# Patient Record
Sex: Male | Born: 1960
Health system: Southern US, Community
[De-identification: ages and names within clinical notes are randomized; demographics above are authoritative.]

## PROBLEM LIST (undated history)

## (undated) DIAGNOSIS — E785 Hyperlipidemia, unspecified: Secondary | ICD-10-CM

## (undated) DIAGNOSIS — C801 Malignant (primary) neoplasm, unspecified: Secondary | ICD-10-CM

## (undated) DIAGNOSIS — M51369 Other intervertebral disc degeneration, lumbar region without mention of lumbar back pain or lower extremity pain: Secondary | ICD-10-CM

## (undated) DIAGNOSIS — M5136 Other intervertebral disc degeneration, lumbar region: Secondary | ICD-10-CM

## (undated) DIAGNOSIS — G4730 Sleep apnea, unspecified: Secondary | ICD-10-CM

## (undated) HISTORY — PX: TONSILLECTOMY: SUR1361

## (undated) HISTORY — PX: KNEE SURGERY: SHX244

## (undated) HISTORY — PX: JOINT REPLACEMENT: SHX530

---

## 2000-12-04 ENCOUNTER — Ambulatory Visit (HOSPITAL_BASED_OUTPATIENT_CLINIC_OR_DEPARTMENT_OTHER): Admission: RE | Admit: 2000-12-04 | Discharge: 2000-12-05 | Payer: Self-pay | Admitting: *Deleted

## 2003-11-24 ENCOUNTER — Ambulatory Visit: Admission: RE | Admit: 2003-11-24 | Discharge: 2003-11-24 | Payer: Self-pay | Admitting: Pulmonary Disease

## 2014-12-19 ENCOUNTER — Encounter: Payer: Self-pay | Admitting: Internal Medicine

## 2016-06-29 ENCOUNTER — Other Ambulatory Visit: Payer: Self-pay | Admitting: Surgery

## 2016-06-29 DIAGNOSIS — S83241D Other tear of medial meniscus, current injury, right knee, subsequent encounter: Secondary | ICD-10-CM

## 2016-06-29 DIAGNOSIS — S86911D Strain of unspecified muscle(s) and tendon(s) at lower leg level, right leg, subsequent encounter: Secondary | ICD-10-CM

## 2016-07-07 ENCOUNTER — Ambulatory Visit
Admission: RE | Admit: 2016-07-07 | Discharge: 2016-07-07 | Disposition: A | Discharge status: Home / Self Care | Payer: 59 | Source: Ambulatory Visit | Attending: Surgery | Admitting: Surgery

## 2016-07-07 DIAGNOSIS — X58XXXD Exposure to other specified factors, subsequent encounter: Secondary | ICD-10-CM | POA: Insufficient documentation

## 2016-07-07 DIAGNOSIS — S86911D Strain of unspecified muscle(s) and tendon(s) at lower leg level, right leg, subsequent encounter: Secondary | ICD-10-CM | POA: Diagnosis present

## 2016-07-07 DIAGNOSIS — S83241D Other tear of medial meniscus, current injury, right knee, subsequent encounter: Secondary | ICD-10-CM | POA: Insufficient documentation

## 2016-07-07 DIAGNOSIS — M1711 Unilateral primary osteoarthritis, right knee: Secondary | ICD-10-CM | POA: Diagnosis not present

## 2016-08-04 ENCOUNTER — Encounter: Payer: Self-pay | Admitting: *Deleted

## 2016-08-10 ENCOUNTER — Encounter
Admission: RE | Disposition: A | Discharge status: Home / Self Care | Payer: Self-pay | Source: Ambulatory Visit | Attending: Surgery

## 2016-08-10 ENCOUNTER — Ambulatory Visit: Payer: 59 | Admitting: Anesthesiology

## 2016-08-10 ENCOUNTER — Ambulatory Visit
Admission: RE | Admit: 2016-08-10 | Discharge: 2016-08-10 | Disposition: A | Discharge status: Home / Self Care | Payer: 59 | Source: Ambulatory Visit | Attending: Surgery | Admitting: Surgery

## 2016-08-10 DIAGNOSIS — G4730 Sleep apnea, unspecified: Secondary | ICD-10-CM | POA: Diagnosis not present

## 2016-08-10 DIAGNOSIS — M1711 Unilateral primary osteoarthritis, right knee: Secondary | ICD-10-CM | POA: Diagnosis not present

## 2016-08-10 DIAGNOSIS — M94261 Chondromalacia, right knee: Secondary | ICD-10-CM | POA: Insufficient documentation

## 2016-08-10 DIAGNOSIS — S83241A Other tear of medial meniscus, current injury, right knee, initial encounter: Secondary | ICD-10-CM | POA: Diagnosis not present

## 2016-08-10 DIAGNOSIS — X58XXXA Exposure to other specified factors, initial encounter: Secondary | ICD-10-CM | POA: Insufficient documentation

## 2016-08-10 DIAGNOSIS — F17200 Nicotine dependence, unspecified, uncomplicated: Secondary | ICD-10-CM | POA: Insufficient documentation

## 2016-08-10 DIAGNOSIS — M659 Synovitis and tenosynovitis, unspecified: Secondary | ICD-10-CM | POA: Diagnosis not present

## 2016-08-10 DIAGNOSIS — Z79899 Other long term (current) drug therapy: Secondary | ICD-10-CM | POA: Insufficient documentation

## 2016-08-10 DIAGNOSIS — E785 Hyperlipidemia, unspecified: Secondary | ICD-10-CM | POA: Diagnosis not present

## 2016-08-10 HISTORY — PX: KNEE ARTHROSCOPY: SHX127

## 2016-08-10 HISTORY — DX: Other intervertebral disc degeneration, lumbar region without mention of lumbar back pain or lower extremity pain: M51.369

## 2016-08-10 HISTORY — DX: Sleep apnea, unspecified: G47.30

## 2016-08-10 HISTORY — DX: Hyperlipidemia, unspecified: E78.5

## 2016-08-10 HISTORY — DX: Other intervertebral disc degeneration, lumbar region: M51.36

## 2016-08-10 SURGERY — ARTHROSCOPY, KNEE
Anesthesia: General | Site: Knee | Laterality: Right | Wound class: Clean

## 2016-08-10 MED ORDER — METOCLOPRAMIDE HCL 5 MG/ML IJ SOLN: 5.0000 mg | Freq: Three times a day (TID) | INTRAMUSCULAR | Status: DC | PRN

## 2016-08-10 MED ORDER — OXYCODONE HCL 5 MG PO TABS
5.0000 mg | ORAL_TABLET | Freq: Once | ORAL | Status: AC | PRN
  Administered 2016-08-10: 5 mg via ORAL

## 2016-08-10 MED ORDER — LIDOCAINE HCL (CARDIAC) 20 MG/ML IV SOLN
INTRAVENOUS | Status: DC | PRN
  Administered 2016-08-10: 50 mg via INTRATRACHEAL

## 2016-08-10 MED ORDER — OXYCODONE HCL 5 MG/5ML PO SOLN: 5.0000 mg | Freq: Once | ORAL | Status: AC | PRN

## 2016-08-10 MED ORDER — DEXAMETHASONE SODIUM PHOSPHATE 4 MG/ML IJ SOLN
INTRAMUSCULAR | Status: DC | PRN
  Administered 2016-08-10: 8 mg via INTRAVENOUS

## 2016-08-10 MED ORDER — FENTANYL CITRATE (PF) 100 MCG/2ML IJ SOLN
INTRAMUSCULAR | Status: DC | PRN
  Administered 2016-08-10: 100 ug via INTRAVENOUS

## 2016-08-10 MED ORDER — ONDANSETRON HCL 4 MG/2ML IJ SOLN: 4.0000 mg | Freq: Four times a day (QID) | INTRAMUSCULAR | Status: DC | PRN

## 2016-08-10 MED ORDER — ONDANSETRON HCL 4 MG/2ML IJ SOLN: 4.0000 mg | Freq: Once | INTRAMUSCULAR | Status: DC | PRN

## 2016-08-10 MED ORDER — PROPOFOL 10 MG/ML IV BOLUS
INTRAVENOUS | Status: DC | PRN
  Administered 2016-08-10: 150 mg via INTRAVENOUS

## 2016-08-10 MED ORDER — ONDANSETRON HCL 4 MG/2ML IJ SOLN
INTRAMUSCULAR | Status: DC | PRN
  Administered 2016-08-10: 4 mg via INTRAVENOUS

## 2016-08-10 MED ORDER — MIDAZOLAM HCL 5 MG/5ML IJ SOLN
INTRAMUSCULAR | Status: DC | PRN
  Administered 2016-08-10: 2 mg via INTRAVENOUS

## 2016-08-10 MED ORDER — HYDROCODONE-ACETAMINOPHEN 5-325 MG PO TABS: 1.0000 | ORAL_TABLET | ORAL | Status: DC | PRN

## 2016-08-10 MED ORDER — ACETAMINOPHEN 160 MG/5ML PO SOLN: 325.0000 mg | ORAL | Status: DC | PRN

## 2016-08-10 MED ORDER — BUPIVACAINE-EPINEPHRINE 0.25% -1:200000 IJ SOLN
INTRAMUSCULAR | Status: DC | PRN
Start: 2016-08-10 — End: 2016-08-10
  Administered 2016-08-10: 20 mL

## 2016-08-10 MED ORDER — METOCLOPRAMIDE HCL 5 MG PO TABS: 5.0000 mg | ORAL_TABLET | Freq: Three times a day (TID) | ORAL | Status: DC | PRN

## 2016-08-10 MED ORDER — ONDANSETRON HCL 4 MG PO TABS: 4.0000 mg | ORAL_TABLET | Freq: Four times a day (QID) | ORAL | Status: DC | PRN

## 2016-08-10 MED ORDER — CEFAZOLIN SODIUM-DEXTROSE 2-4 GM/100ML-% IV SOLN
2.0000 g | Freq: Once | INTRAVENOUS | Status: AC
  Administered 2016-08-10: 2 g via INTRAVENOUS

## 2016-08-10 MED ORDER — HYDROCODONE-ACETAMINOPHEN 5-325 MG PO TABS: 1.0000 | ORAL_TABLET | Freq: Four times a day (QID) | ORAL | 0 refills | Status: DC | PRN

## 2016-08-10 MED ORDER — ACETAMINOPHEN 325 MG PO TABS
325.0000 mg | ORAL_TABLET | ORAL | Status: DC | PRN
  Administered 2016-08-10: 650 mg via ORAL

## 2016-08-10 MED ORDER — LIDOCAINE HCL 1 % IJ SOLN
INTRAMUSCULAR | Status: DC | PRN
  Administered 2016-08-10: 60 mL via INTRAMUSCULAR

## 2016-08-10 MED ORDER — POTASSIUM CHLORIDE IN NACL 20-0.9 MEQ/L-% IV SOLN: INTRAVENOUS | Status: DC

## 2016-08-10 MED ORDER — LACTATED RINGERS IV SOLN
INTRAVENOUS | Status: DC
  Administered 2016-08-10: 12:00:00 via INTRAVENOUS

## 2016-08-10 MED ORDER — HYDROMORPHONE HCL 1 MG/ML IJ SOLN: 0.2500 mg | INTRAMUSCULAR | Status: DC | PRN

## 2016-08-10 SURGICAL SUPPLY — 32 items
BANDAGE ELASTIC 6 LF NS (GAUZE/BANDAGES/DRESSINGS) ×2 IMPLANT
BLADE FULL RADIUS 3.5 (BLADE) ×2 IMPLANT
BNDG CMPR MED 5X6 ELC HKLP NS (GAUZE/BANDAGES/DRESSINGS) ×1
BUR ACROMIONIZER 4.0 (BURR) ×1 IMPLANT
CHLORAPREP W/TINT 26ML (MISCELLANEOUS) ×2 IMPLANT
COVER LIGHT HANDLE UNIVERSAL (MISCELLANEOUS) ×4 IMPLANT
CUFF TOURN SGL QUICK 30 (MISCELLANEOUS)
CUFF TOURN SGL QUICK 34 (TOURNIQUET CUFF) ×2
CUFF TRNQT CYL 34X4X40X1 (TOURNIQUET CUFF) IMPLANT
CUFF TRNQT CYL LO 30X4X (MISCELLANEOUS) IMPLANT
DRAPE IMP U-DRAPE 54X76 (DRAPES) ×2 IMPLANT
GAUZE SPONGE 4X4 12PLY STRL (GAUZE/BANDAGES/DRESSINGS) ×2 IMPLANT
GLOVE BIO SURGEON STRL SZ8 (GLOVE) ×4 IMPLANT
GLOVE INDICATOR 8.0 STRL GRN (GLOVE) ×2 IMPLANT
GOWN STRL REUS W/ TWL LRG LVL3 (GOWN DISPOSABLE) ×1 IMPLANT
GOWN STRL REUS W/ TWL XL LVL3 (GOWN DISPOSABLE) ×1 IMPLANT
GOWN STRL REUS W/TWL LRG LVL3 (GOWN DISPOSABLE) ×2
GOWN STRL REUS W/TWL XL LVL3 (GOWN DISPOSABLE) ×2
IV LACTATED RINGER IRRG 3000ML (IV SOLUTION) ×2
IV LR IRRIG 3000ML ARTHROMATIC (IV SOLUTION) ×2 IMPLANT
KIT ROOM TURNOVER OR (KITS) ×2 IMPLANT
MANIFOLD 4PT FOR NEPTUNE1 (MISCELLANEOUS) ×2 IMPLANT
NDL HYPO 21X1.5 SAFETY (NEEDLE) ×2 IMPLANT
NEEDLE HYPO 21X1.5 SAFETY (NEEDLE) ×4 IMPLANT
PACK ARTHROSCOPY KNEE (MISCELLANEOUS) ×2 IMPLANT
STRAP BODY AND KNEE 60X3 (MISCELLANEOUS) ×2 IMPLANT
SUT PROLENE 4 0 PS 2 18 (SUTURE) ×2 IMPLANT
SUT VIC AB 2-0 CT1 27 (SUTURE)
SUT VIC AB 2-0 CT1 TAPERPNT 27 (SUTURE) IMPLANT
SYR 50ML LL SCALE MARK (SYRINGE) ×2 IMPLANT
TUBING ARTHRO INFLOW-ONLY STRL (TUBING) ×2 IMPLANT
WAND HAND CNTRL MULTIVAC 90 (MISCELLANEOUS) ×1 IMPLANT

## 2016-08-10 NOTE — Discharge Instructions (Signed)
General Anesthesia, Adult, Care After These instructions provide you with information about caring for yourself after your procedure. Your health care provider may also give you more specific instructions. Your treatment has been planned according to current medical practices, but problems sometimes occur. Call your health care provider if you have any problems or questions after your procedure. What can I expect after the procedure? After the procedure, it is common to have:  Vomiting.  A sore throat.  Mental slowness. It is common to feel:  Nauseous.  Cold or shivery.  Sleepy.  Tired.  Sore or achy, even in parts of your body where you did not have surgery. Follow these instructions at home: For at least 24 hours after the procedure:  Do not:  Participate in activities where you could fall or become injured.  Drive.  Use heavy machinery.  Drink alcohol.  Take sleeping pills or medicines that cause drowsiness.  Make important decisions or sign legal documents.  Take care of children on your own.  Rest. Eating and drinking  If you vomit, drink water, juice, or soup when you can drink without vomiting.  Drink enough fluid to keep your urine clear or pale yellow.  Make sure you have little or no nausea before eating solid foods.  Follow the diet recommended by your health care provider. General instructions  Have a responsible adult stay with you until you are awake and alert.  Return to your normal activities as told by your health care provider. Ask your health care provider what activities are safe for you.  Take over-the-counter and prescription medicines only as told by your health care provider.  If you smoke, do not smoke without supervision.  Keep all follow-up visits as told by your health care provider. This is important. Contact a health care provider if:  You continue to have nausea or vomiting at home, and medicines are not helpful.  You  cannot drink fluids or start eating again.  You cannot urinate after 8-12 hours.  You develop a skin rash.  You have fever.  You have increasing redness at the site of your procedure. Get help right away if:  You have difficulty breathing.  You have chest pain.  You have unexpected bleeding.  You feel that you are having a life-threatening or urgent problem. This information is not intended to replace advice given to you by your health care provider. Make sure you discuss any questions you have with your health care provider. Document Released: 11/21/2000 Document Revised: 01/18/2016 Document Reviewed: 07/30/2015 Elsevier Interactive Patient Education  2017 [place] dressing dry and intact.  May shower after dressing changed on post-op day #4 (Sunday).  Cover sutures with Band-Aids after drying off. Apply ice frequently to knee. Take ibuprofen 800 mg TID with meals for 7-10 days, then as necessary. Take pain medication as prescribed or ES Tylenol when needed.  May weight-bear as tolerated - use crutches or walker as needed. Follow-up in 10-14 days or as scheduled.

## 2016-08-10 NOTE — Op Note (Signed)
08/10/2016  1:38 PM  Patient:   Tommy Small  Pre-Op Diagnosis:   Medial meniscus tear with underlying degenerative joint disease, right knee.  Postoperative diagnosis:   Same.  Procedure:   Arthroscopic abrasion chondroplasty with microfracturing of lateral femoral condylar lesion, arthroscopic abrasion chondroplasty of diffuse grade 3 chondromalacial changes of femoral trochlea, and arthroscopic partial medial meniscectomy, right knee.  Surgeon:   Pascal Lux, M.D.  Anesthesia:   General LMA.  Findings:   As above. There also were grade 2 chondromalacial changes involving the medial femoral condyle, as well as mild central fraying of the anterolateral portion of the lateral meniscus. The anterior and posterior cruciate ligaments both were in excellent condition. There also was extensive synovitis anteriorly, anterolaterally, and anteromedially.  Complications:   None.  EBL:   10 cc.  Total fluids:   600 cc of crystalloid.  Tourniquet time:   None  Drains:   None  Closure:   4-0 Prolene interrupted sutures.  Brief clinical note:   The patient is a 55 year old male with a several month history of medial sided right knee pain. His symptoms have persisted despite medications, activity modification, etc. His history and examination are consistent with a medial meniscus tear confirmed by MRI scan. The MRI scan also suggested some degenerative changes within the knee. The patient presents at this time for arthroscopy, debridement, and partial medial meniscectomy.  Procedure:   The patient was brought into the operating room and lain in the supine position. After adequate general laryngeal mask anesthesia was obtained, a timeout was performed to verify the appropriate side. The patient's right knee was injected sterilely using a solution of 30 cc of 1% lidocaine and 30 cc of 0.5% Sensorcaine with epinephrine. The right lower extremity was prepped with ChloraPrep solution before being  draped sterilely. Preoperative antibiotics were administered. The expected portal sites were injected with 0.25% Sensorcaine with epinephrine before the camera was placed in the anterolateral portal and instrumentation performed through the anteromedial portal. The knee was sequentially examined beginning in the suprapatellar pouch, then progressing to the patellofemoral space, the medial gutter compartment, the notch, and finally the lateral compartment and gutter. The findings were as described above. Abundant reactive synovial tissues anteriorly were debrided using the full-radius resector in order to improve visualization. There was a primarily horizontal tear involving the posterior medial portion of the medial meniscus with an unstable flap component. This area was debrided back to stable margins using, patient of the straight baskets and full-radius resector. Some probing the remaining rim demonstrated excellent stability. Laterally, there was mild fraying of the anterolateral aspect of the meniscus centrally which was debrided back to stable margins using the full-radius resector. The areas of grade 2 and grade 3 chondromalacia involving the medial femoral condyle and femoral trochlea respectively were debrided back to stable margins using the full-radius resector. Laterally, there was a focal area of more extensive degenerative changes involving the weightbearing portion of the lateral femoral condyle measuring approximately 1.5 cm in diameter. This area was debrided back to stable margins using the full-radius resector. As it was full-thickness, the margins were curetted with an arthroscopic curet before numerous perforations were made into the subchondral bone using an arthroscopic awl to complete the microfracture technique. The inflow was turned off briefly to verify that appropriate blood flow from the bone marrow entered the area of microfracturing. Once this was verified, the instruments were removed  from the joint after suctioning the excess fluid. The  portal sites were closed using 4-0 Prolene interrupted sutures before a sterile bulky dressing was applied to the knee. The patient was then awakened, extubated, and returned to the recovery room in satisfactory condition after tolerating the procedure well.

## 2016-08-10 NOTE — Anesthesia Postprocedure Evaluation (Deleted)
Anesthesia Post Note  Patient: Tommy Small  Procedure(s) Performed: Procedure(s) (LRB): ARTHROSCOPY KNEE WITH DEBRIDEMENT,AND PARTIAL MEDIAL MENISCECTOMY, chondroplasty with microfracture of lateral femoral chondral (Right)  Anesthesia Post Evaluation  Trecia Rogers

## 2016-08-10 NOTE — H&P (Signed)
Paper H&P to be scanned into permanent record. H&P reviewed. No changes. 

## 2016-08-10 NOTE — Anesthesia Postprocedure Evaluation (Signed)
Anesthesia Post Note  Patient: Tommy Small  Procedure(s) Performed: Procedure(s) (LRB): ARTHROSCOPY KNEE WITH DEBRIDEMENT,AND PARTIAL MEDIAL MENISCECTOMY, chondroplasty with microfracture of lateral femoral chondral (Right)  Patient location during evaluation: PACU Anesthesia Type: General Level of consciousness: awake and alert Pain management: pain level controlled Vital Signs Assessment: post-procedure vital signs reviewed and stable Respiratory status: spontaneous breathing, nonlabored ventilation, respiratory function stable and patient connected to nasal cannula oxygen Cardiovascular status: blood pressure returned to baseline and stable Postop Assessment: no signs of nausea or vomiting Anesthetic complications: no    Trecia Rogers

## 2016-08-10 NOTE — Transfer of Care (Signed)
Immediate Anesthesia Transfer of Care Note  Patient: Tommy Small  Procedure(s) Performed: Procedure(s) with comments: ARTHROSCOPY KNEE WITH DEBRIDEMENT,AND PARTIAL MEDIAL MENISCECTOMY, microfracture of lateral chondral (Right) - sleep apnea  Patient Location: PACU  Anesthesia Type: General LMA  Level of Consciousness: awake, alert  and patient cooperative  Airway and Oxygen Therapy: Patient Spontanous Breathing and Patient connected to supplemental oxygen  Post-op Assessment: Post-op Vital signs reviewed, Patient's Cardiovascular Status Stable, Respiratory Function Stable, Patent Airway and No signs of Nausea or vomiting  Post-op Vital Signs: Reviewed and stable  Complications: No apparent anesthesia complications

## 2016-08-10 NOTE — Anesthesia Procedure Notes (Signed)
Procedure Name: LMA Insertion Date/Time: 08/10/2016 12:26 PM Performed by: Londell Moh Pre-anesthesia Checklist: Patient identified, Emergency Drugs available, Suction available, Timeout performed and Patient being monitored Patient Re-evaluated:Patient Re-evaluated prior to inductionOxygen Delivery Method: Circle system utilized Preoxygenation: Pre-oxygenation with 100% oxygen Intubation Type: IV induction LMA: LMA inserted LMA Size: 4.0 Number of attempts: 1 Placement Confirmation: positive ETCO2 and breath sounds checked- equal and bilateral Tube secured with: Tape

## 2016-08-10 NOTE — Anesthesia Preprocedure Evaluation (Signed)
Anesthesia Evaluation  Patient identified by MRN, date of birth, ID band Patient awake    Reviewed: Allergy & Precautions, H&P , NPO status , Patient's Chart, lab work & pertinent test results, reviewed documented beta blocker date and time   Airway Mallampati: II  TM Distance: >3 FB Neck ROM: full    Dental no notable dental hx.    Pulmonary sleep apnea , Current Smoker,    Pulmonary exam normal breath sounds clear to auscultation       Cardiovascular Exercise Tolerance: Good negative cardio ROS Normal cardiovascular exam Rhythm:regular Rate:Normal     Neuro/Psych negative neurological ROS  negative psych ROS   GI/Hepatic negative GI ROS, Neg liver ROS,   Endo/Other  negative endocrine ROS  Renal/GU negative Renal ROS  negative genitourinary   Musculoskeletal   Abdominal   Peds  Hematology negative hematology ROS (+)   Anesthesia Other Findings   Reproductive/Obstetrics negative OB ROS                             Anesthesia Physical Anesthesia Plan  ASA: II  Anesthesia Plan: General LMA   Post-op Pain Management:    Induction:   Airway Management Planned:   Additional Equipment:   Intra-op Plan:   Post-operative Plan:   Informed Consent: I have reviewed the patients History and Physical, chart, labs and discussed the procedure including the risks, benefits and alternatives for the proposed anesthesia with the patient or authorized representative who has indicated his/her understanding and acceptance.   Dental Advisory Given  Plan Discussed with: CRNA and Anesthesiologist  Anesthesia Plan Comments:         Anesthesia Quick Evaluation

## 2016-08-11 ENCOUNTER — Encounter: Payer: Self-pay | Admitting: Surgery

## 2016-09-14 DIAGNOSIS — M9902 Segmental and somatic dysfunction of thoracic region: Secondary | ICD-10-CM | POA: Diagnosis not present

## 2016-09-14 DIAGNOSIS — M9901 Segmental and somatic dysfunction of cervical region: Secondary | ICD-10-CM | POA: Diagnosis not present

## 2016-09-14 DIAGNOSIS — M5412 Radiculopathy, cervical region: Secondary | ICD-10-CM | POA: Diagnosis not present

## 2016-09-15 DIAGNOSIS — M9901 Segmental and somatic dysfunction of cervical region: Secondary | ICD-10-CM | POA: Diagnosis not present

## 2016-09-15 DIAGNOSIS — M5412 Radiculopathy, cervical region: Secondary | ICD-10-CM | POA: Diagnosis not present

## 2016-09-15 DIAGNOSIS — M9902 Segmental and somatic dysfunction of thoracic region: Secondary | ICD-10-CM | POA: Diagnosis not present

## 2016-09-16 DIAGNOSIS — M5412 Radiculopathy, cervical region: Secondary | ICD-10-CM | POA: Diagnosis not present

## 2016-09-16 DIAGNOSIS — M9901 Segmental and somatic dysfunction of cervical region: Secondary | ICD-10-CM | POA: Diagnosis not present

## 2016-09-16 DIAGNOSIS — M9902 Segmental and somatic dysfunction of thoracic region: Secondary | ICD-10-CM | POA: Diagnosis not present

## 2016-09-19 DIAGNOSIS — G4733 Obstructive sleep apnea (adult) (pediatric): Secondary | ICD-10-CM | POA: Diagnosis not present

## 2016-09-19 DIAGNOSIS — M9901 Segmental and somatic dysfunction of cervical region: Secondary | ICD-10-CM | POA: Diagnosis not present

## 2016-09-19 DIAGNOSIS — M5412 Radiculopathy, cervical region: Secondary | ICD-10-CM | POA: Diagnosis not present

## 2016-09-19 DIAGNOSIS — M9902 Segmental and somatic dysfunction of thoracic region: Secondary | ICD-10-CM | POA: Diagnosis not present

## 2016-09-21 DIAGNOSIS — M9902 Segmental and somatic dysfunction of thoracic region: Secondary | ICD-10-CM | POA: Diagnosis not present

## 2016-09-21 DIAGNOSIS — M9901 Segmental and somatic dysfunction of cervical region: Secondary | ICD-10-CM | POA: Diagnosis not present

## 2016-09-21 DIAGNOSIS — M5412 Radiculopathy, cervical region: Secondary | ICD-10-CM | POA: Diagnosis not present

## 2016-09-22 DIAGNOSIS — M9901 Segmental and somatic dysfunction of cervical region: Secondary | ICD-10-CM | POA: Diagnosis not present

## 2016-09-22 DIAGNOSIS — M5412 Radiculopathy, cervical region: Secondary | ICD-10-CM | POA: Diagnosis not present

## 2016-09-22 DIAGNOSIS — M9902 Segmental and somatic dysfunction of thoracic region: Secondary | ICD-10-CM | POA: Diagnosis not present

## 2016-11-07 DIAGNOSIS — Z125 Encounter for screening for malignant neoplasm of prostate: Secondary | ICD-10-CM | POA: Diagnosis not present

## 2016-11-07 DIAGNOSIS — Z0000 Encounter for general adult medical examination without abnormal findings: Secondary | ICD-10-CM | POA: Diagnosis not present

## 2016-11-11 DIAGNOSIS — K219 Gastro-esophageal reflux disease without esophagitis: Secondary | ICD-10-CM | POA: Diagnosis not present

## 2016-11-11 DIAGNOSIS — Z0001 Encounter for general adult medical examination with abnormal findings: Secondary | ICD-10-CM | POA: Diagnosis not present

## 2016-11-11 DIAGNOSIS — Z8614 Personal history of Methicillin resistant Staphylococcus aureus infection: Secondary | ICD-10-CM | POA: Diagnosis not present

## 2017-01-10 DIAGNOSIS — L821 Other seborrheic keratosis: Secondary | ICD-10-CM | POA: Diagnosis not present

## 2017-01-10 DIAGNOSIS — C4441 Basal cell carcinoma of skin of scalp and neck: Secondary | ICD-10-CM | POA: Diagnosis not present

## 2017-01-10 DIAGNOSIS — D485 Neoplasm of uncertain behavior of skin: Secondary | ICD-10-CM | POA: Diagnosis not present

## 2017-01-10 DIAGNOSIS — L570 Actinic keratosis: Secondary | ICD-10-CM | POA: Diagnosis not present

## 2017-02-16 DIAGNOSIS — C4441 Basal cell carcinoma of skin of scalp and neck: Secondary | ICD-10-CM | POA: Diagnosis not present

## 2017-02-16 DIAGNOSIS — L908 Other atrophic disorders of skin: Secondary | ICD-10-CM | POA: Diagnosis not present

## 2017-02-16 DIAGNOSIS — L578 Other skin changes due to chronic exposure to nonionizing radiation: Secondary | ICD-10-CM | POA: Diagnosis not present

## 2017-09-21 DIAGNOSIS — J209 Acute bronchitis, unspecified: Secondary | ICD-10-CM | POA: Diagnosis not present

## 2017-09-21 DIAGNOSIS — J019 Acute sinusitis, unspecified: Secondary | ICD-10-CM | POA: Diagnosis not present

## 2017-10-05 IMAGING — MR MR KNEE*R* W/O CM
5 series · 39 of 40 positions shown · non-contrast
Comparison: None.

CLINICAL DATA: Medial right knee pain for 1 month. Question
meniscal tear. No known injury.

EXAM:
MRI OF THE RIGHT KNEE WITHOUT CONTRAST
TECHNIQUE: Multiplanar, multisequence MR imaging of the knee was performed. No
intravenous contrast was administered.

[Series 3: PD fat-sat · axial · 3.0mm · 0.33mm/px · z∈[-47,+72]mm · 8 of 37 slices shown (1 of 3)]
[im 1/37]
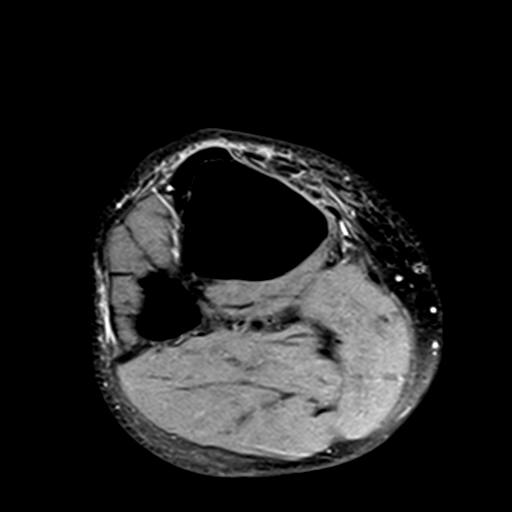
[im 6/37]
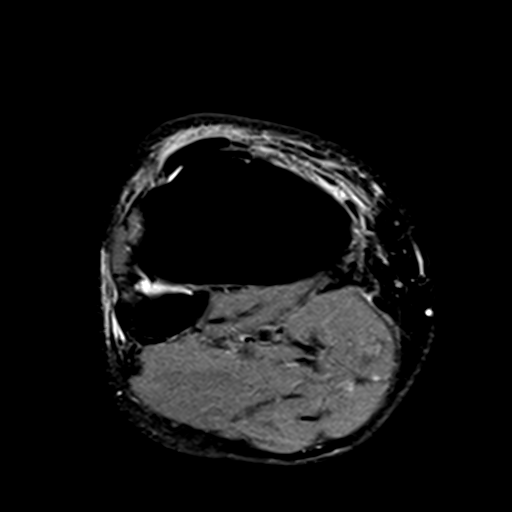
[im 11/37]
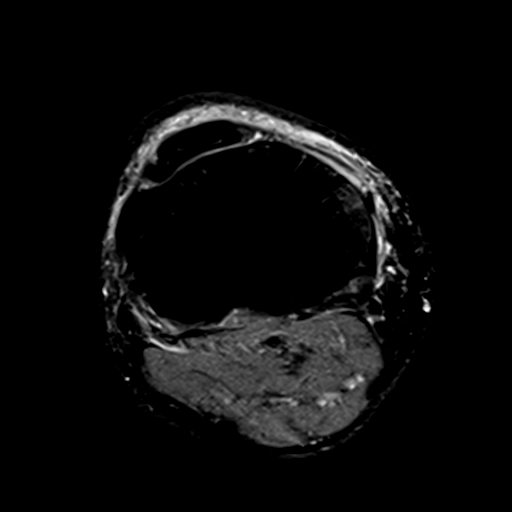
[im 16/37]
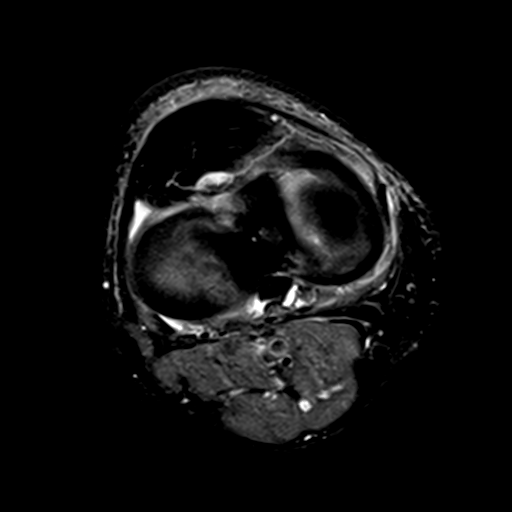
[im 21/37]
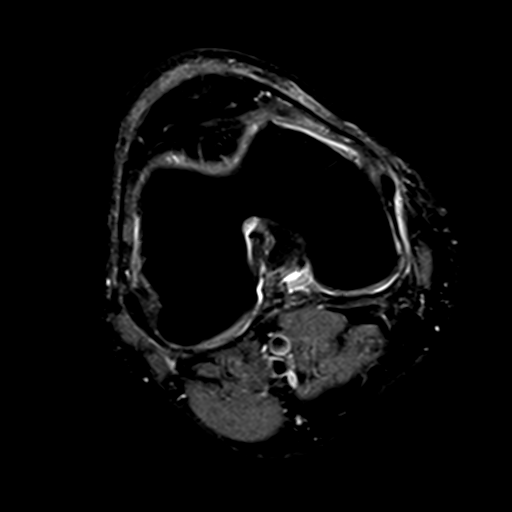
[im 26/37]
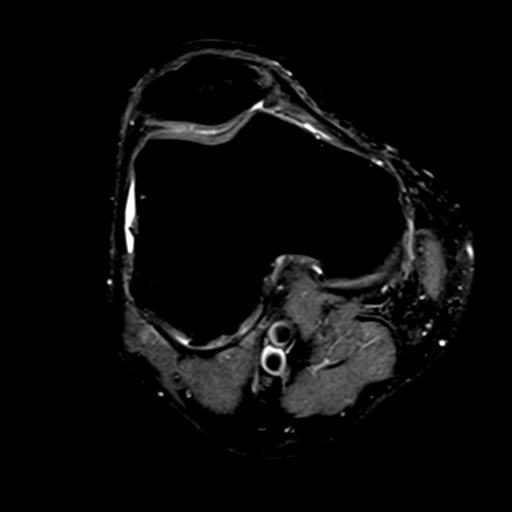
[im 31/37]
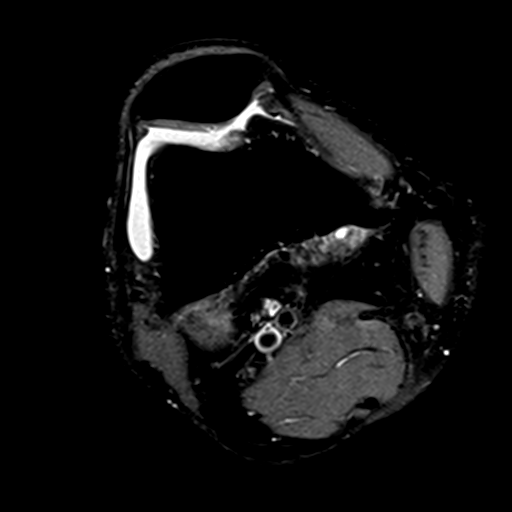
[im 37/37]
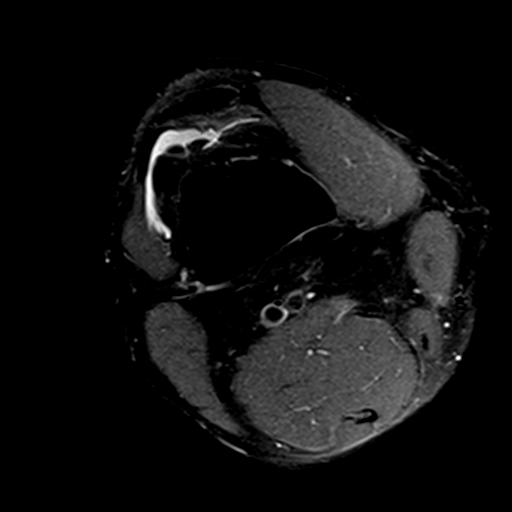

[Series 4: T1 · coronal · 3.0mm · 0.50mm/px · 7 of 33 slices shown]
[im 1/33]
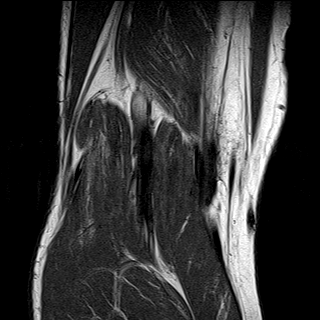
[im 5/33]
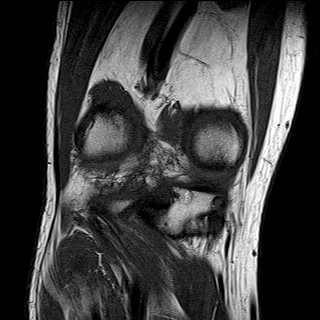
[im 10/33]
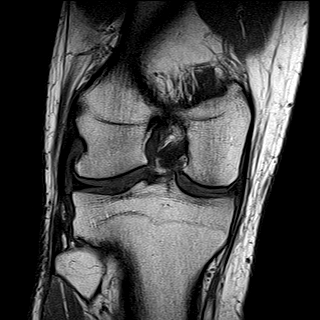
[im 14/33]
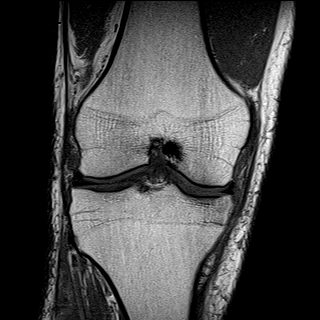
[im 19/33]
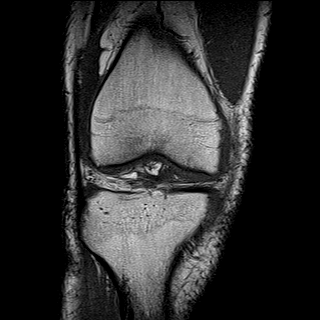
[im 23/33]
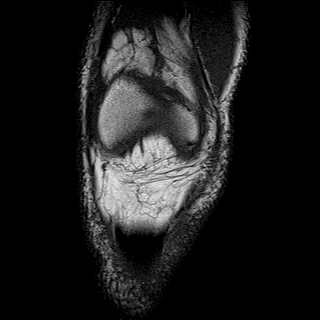
[im 28/33]
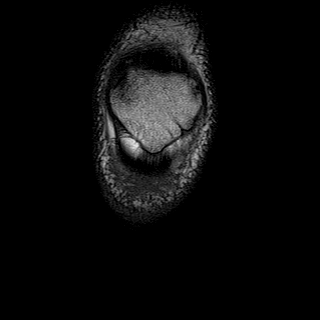

[Series 5: T2 fat-sat · coronal · 3.0mm · 0.50mm/px · 8 of 33 slices shown]
[im 1/33]
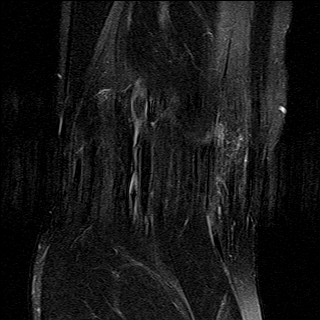
[im 5/33]
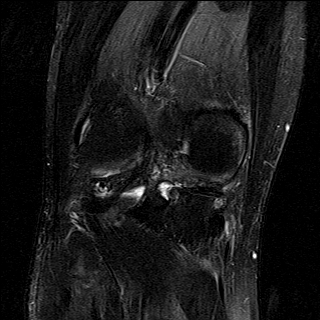
[im 10/33]
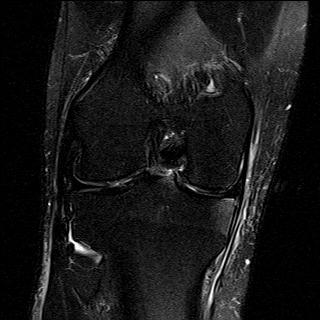
[im 14/33]
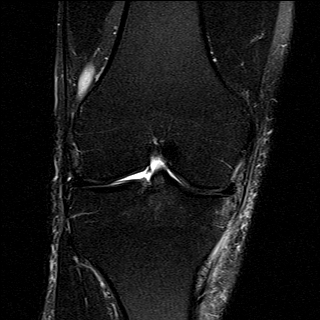
[im 19/33]
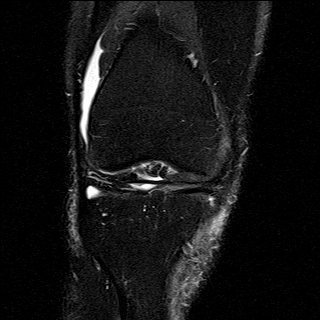
[im 23/33]
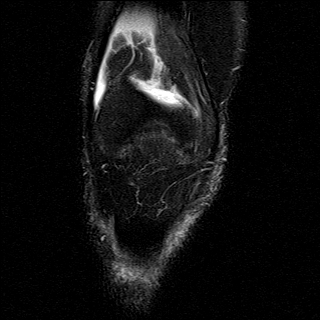
[im 28/33]
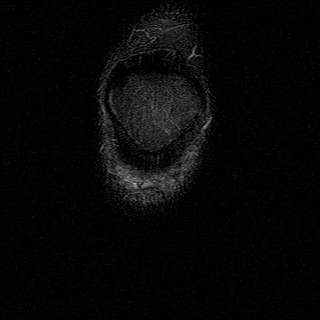
[im 33/33]
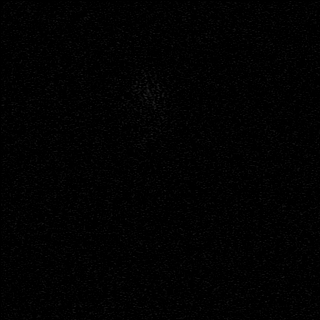

[Series 6: PD fat-sat · coronal · 3.0mm · 0.62mm/px · 8 of 33 slices shown (2 of 3)]
[im 1/33]
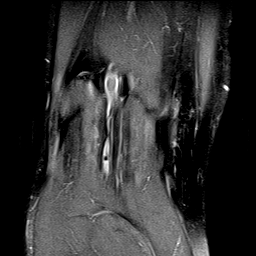
[im 5/33]
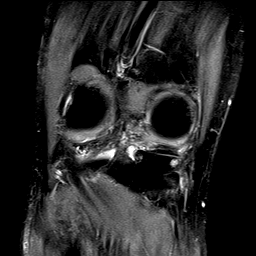
[im 10/33]
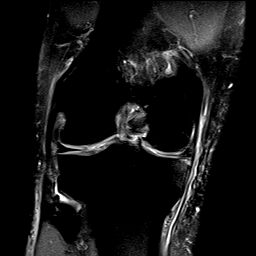
[im 14/33]
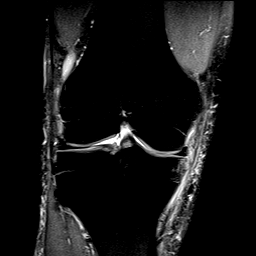
[im 19/33]
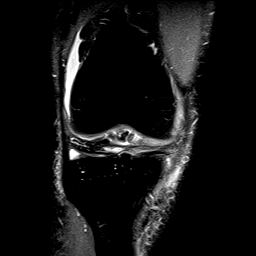
[im 23/33]
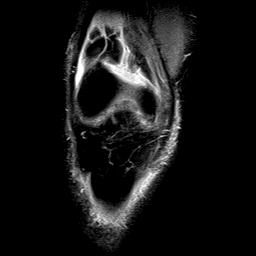
[im 28/33]
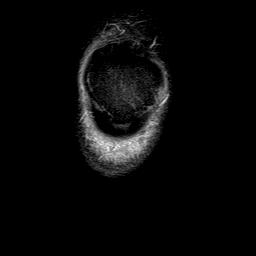
[im 33/33]
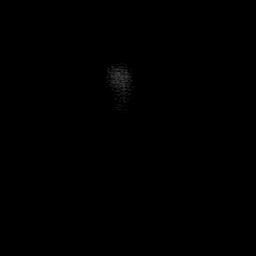

[Series 7: PD fat-sat · sagittal · 3.0mm · 0.62mm/px · 8 of 35 slices shown (3 of 3)]
[im 1/35]
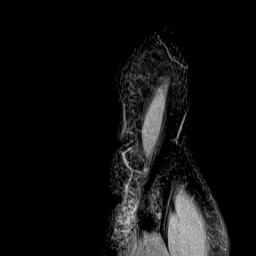
[im 5/35]
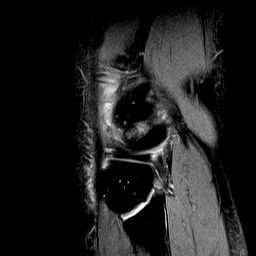
[im 10/35]
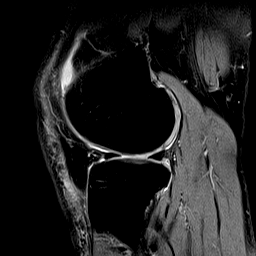
[im 15/35]
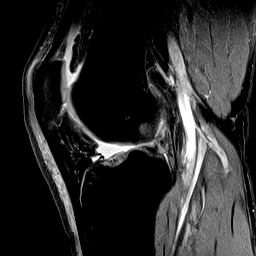
[im 20/35]
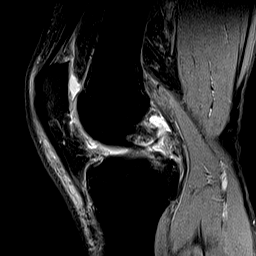
[im 25/35]
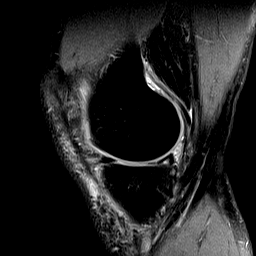
[im 30/35]
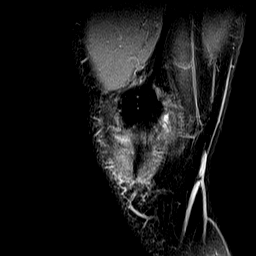
[im 35/35]
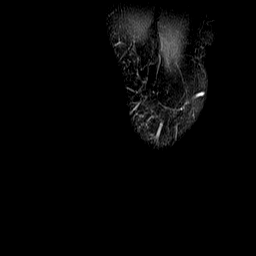

[39 of 40 positions shown; findings below may reference images not displayed]

FINDINGS: MENISCI

Medial meniscus: A large horizontal tear in the posterior horn
extends into the posterior aspect of the body. No displaced
fragment.

Lateral meniscus:  Intact.

LIGAMENTS

Cruciates:  Intact.

Collaterals:  Intact.

CARTILAGE

Patellofemoral: Markedly thinned along the lateral facet. Small
fissures at the apex in the midpole noted.

Medial:  Mildly degenerated.

Lateral:  Unremarkable.

Joint:  Small joint effusion.

Popliteal Fossa:  No Baker's cyst.

Extensor Mechanism:  Intact.

Bones: Mild marrow edema in the periphery of the medial tibial
plateau noted. No fracture or worrisome marrow lesion.

Other: None.
IMPRESSION: Large horizontal tear posterior horn and posterior body of the
medial meniscus. No displaced fragment.

Mild patellofemoral degenerative change.

## 2017-10-25 DIAGNOSIS — J209 Acute bronchitis, unspecified: Secondary | ICD-10-CM | POA: Diagnosis not present

## 2017-11-14 DIAGNOSIS — Z0001 Encounter for general adult medical examination with abnormal findings: Secondary | ICD-10-CM | POA: Diagnosis not present

## 2017-11-14 DIAGNOSIS — Z8614 Personal history of Methicillin resistant Staphylococcus aureus infection: Secondary | ICD-10-CM | POA: Diagnosis not present

## 2017-11-16 DIAGNOSIS — M545 Low back pain: Secondary | ICD-10-CM | POA: Diagnosis not present

## 2018-03-29 DIAGNOSIS — R109 Unspecified abdominal pain: Secondary | ICD-10-CM | POA: Diagnosis not present

## 2018-03-30 ENCOUNTER — Emergency Department: Payer: 59

## 2018-03-30 ENCOUNTER — Other Ambulatory Visit: Payer: Self-pay

## 2018-03-30 ENCOUNTER — Encounter: Payer: Self-pay | Admitting: Emergency Medicine

## 2018-03-30 ENCOUNTER — Emergency Department
Admission: EM | Admit: 2018-03-30 | Discharge: 2018-03-30 | Disposition: A | Discharge status: Home / Self Care | Payer: 59 | Attending: Emergency Medicine | Admitting: Emergency Medicine

## 2018-03-30 DIAGNOSIS — K8080 Other cholelithiasis without obstruction: Secondary | ICD-10-CM | POA: Diagnosis not present

## 2018-03-30 DIAGNOSIS — R109 Unspecified abdominal pain: Secondary | ICD-10-CM | POA: Diagnosis not present

## 2018-03-30 DIAGNOSIS — K8018 Calculus of gallbladder with other cholecystitis without obstruction: Secondary | ICD-10-CM | POA: Diagnosis not present

## 2018-03-30 DIAGNOSIS — K8020 Calculus of gallbladder without cholecystitis without obstruction: Secondary | ICD-10-CM | POA: Diagnosis not present

## 2018-03-30 DIAGNOSIS — R1032 Left lower quadrant pain: Secondary | ICD-10-CM | POA: Diagnosis not present

## 2018-03-30 DIAGNOSIS — F17290 Nicotine dependence, other tobacco product, uncomplicated: Secondary | ICD-10-CM | POA: Insufficient documentation

## 2018-03-30 HISTORY — DX: Malignant (primary) neoplasm, unspecified: C80.1

## 2018-03-30 LAB — CBC
HEMATOCRIT: 41.5 % (ref 40.0–52.0)
HEMOGLOBIN: 14.8 g/dL (ref 13.0–18.0)
MCH: 31.3 pg (ref 26.0–34.0)
MCHC: 35.6 g/dL (ref 32.0–36.0)
MCV: 88 fL (ref 80.0–100.0)
Platelets: 170 10*3/uL (ref 150–440)
RBC: 4.72 MIL/uL (ref 4.40–5.90)
RDW: 13.2 % (ref 11.5–14.5)
WBC: 6.5 10*3/uL (ref 3.8–10.6)

## 2018-03-30 LAB — COMPREHENSIVE METABOLIC PANEL
ALBUMIN: 4.7 g/dL (ref 3.5–5.0)
ALK PHOS: 43 U/L (ref 38–126)
ALT: 37 U/L (ref 0–44)
ANION GAP: 6 (ref 5–15)
AST: 24 U/L (ref 15–41)
BUN: 21 mg/dL — ABNORMAL HIGH (ref 6–20)
CHLORIDE: 107 mmol/L (ref 98–111)
CO2: 26 mmol/L (ref 22–32)
Calcium: 9 mg/dL (ref 8.9–10.3)
Creatinine, Ser: 1.08 mg/dL (ref 0.61–1.24)
GFR calc Af Amer: >60 mL/min (ref >60)
GFR calc non Af Amer: >60 mL/min (ref >60)
GLUCOSE: 86 mg/dL (ref 70–99)
Potassium: 4.3 mmol/L (ref 3.5–5.1)
SODIUM: 139 mmol/L (ref 135–145)
Total Bilirubin: 0.5 mg/dL (ref 0.3–1.2)
Total Protein: 7.2 g/dL (ref 6.5–8.1)

## 2018-03-30 LAB — URINALYSIS, COMPLETE (UACMP) WITH MICROSCOPIC
Bacteria, UA: NONE SEEN
Bilirubin Urine: NEGATIVE
GLUCOSE, UA: NEGATIVE mg/dL
Hgb urine dipstick: NEGATIVE
KETONES UR: NEGATIVE mg/dL
Leukocytes, UA: NEGATIVE
Nitrite: NEGATIVE
Protein, ur: NEGATIVE mg/dL
Specific Gravity, Urine: 1.013 (ref 1.005–1.030)
pH: 5 (ref 5.0–8.0)

## 2018-03-30 LAB — LIPASE, BLOOD: LIPASE: 23 U/L (ref 11–51)

## 2018-03-30 MED ORDER — IOHEXOL 300 MG/ML  SOLN
100.0000 mL | Freq: Once | INTRAMUSCULAR | Status: AC | PRN
  Administered 2018-03-30: 100 mL via INTRAVENOUS

## 2018-03-30 NOTE — Discharge Instructions (Addendum)
Your lab work and CT scan today were unremarkable.  No serious issues were identified.  You do have some gallstones and should follow-up with your primary care doctor for continued monitoring of your symptoms.

## 2018-03-30 NOTE — ED Triage Notes (Signed)
Pt to ED via POV c/o LLQ abdominal pain x 3 days. Pt denies N/V/D. Pt denies fever. Pt is in NAD at this time.

## 2018-03-30 NOTE — ED Provider Notes (Signed)
[facility] Emergency Department Provider Note  ____________________________________________  Time seen: Approximately 10:46 PM  I have reviewed the triage vital signs and the nursing notes.   HISTORY  Chief Complaint Abdominal Pain    HPI Tommy Small. is a 57 y.o. male who complains of gradual onset left lower quadrant abdominal pain for the past 3 days, worse with change in position and sitting up.  No alleviating factors.  Nonradiating.  No fevers chills vomiting or diarrhea.  No constipation.  Never had anything like this before.  Went to urgent care yesterday was told was a pulled muscle.  Had unremarkable x-ray at that time according the patient.  Went back to urgent care today, repeat x-ray again was unremarkable.  He was sent to the ED for CT scan.  Symptoms are described as aching and mild to moderate in intensity.      Past Medical History:  Diagnosis Date  . Cancer ([place])      There are no active problems to display for this patient.    Past Surgical History:  Procedure Laterality Date  . JOINT REPLACEMENT       Prior to Admission medications   Not on File     Allergies Patient has no known allergies.   No family history on file.  Social History Social History   Tobacco Use  . Smoking status: Current Every Day Smoker    Types: Cigars  . Smokeless tobacco: Never Used  Substance Use Topics  . Alcohol use: Yes    Comment: Occ  . Drug use: Not Currently    Review of Systems  Constitutional:   No fever or chills.  Cardiovascular:   No chest pain or syncope. Respiratory:   No dyspnea or cough. Gastrointestinal:   Positive as above for abdominal pain without vomiting or diarrhea Musculoskeletal:   Negative for focal pain or swelling All other systems reviewed and are negative except as documented above in ROS and HPI.  ____________________________________________   PHYSICAL EXAM:  VITAL SIGNS: ED Triage Vitals   Enc Vitals Group     BP 03/30/18 1531 129/78     Pulse Rate 03/30/18 1531 70     Resp 03/30/18 1531 16     Temp 03/30/18 1531 98.9 F (37.2 C)     Temp Source 03/30/18 1531 Oral     SpO2 03/30/18 1531 97 %     Weight 03/30/18 1532 200 lb (90.7 kg)     Height 03/30/18 1532 5\' 9"  (1.753 m)     Head Circumference --      Peak Flow --      Pain Score 03/30/18 1531 2     Pain Loc --      Pain Edu? --      Excl. in [place]? --     Vital signs reviewed, nursing assessments reviewed.   Constitutional:   Alert and oriented. Non-toxic appearance. Eyes:   Conjunctivae are normal. EOMI. PERRL. ENT      Head:   Normocephalic and atraumatic.      Nose:   No congestion/rhinnorhea.       Mouth/Throat:   MMM, no pharyngeal erythema. No peritonsillar mass.       Neck:   No meningismus. Full ROM. Hematological/Lymphatic/Immunilogical:   No cervical lymphadenopathy. Cardiovascular:   RRR. Symmetric bilateral radial and DP pulses.  No murmurs. Cap refill less than 2 seconds. Respiratory:   Normal respiratory effort without tachypnea/retractions. Breath sounds are clear and  equal bilaterally. No wheezes/rales/rhonchi. Gastrointestinal:   Soft with focal left lower quadrant abdominal tenderness.. Non distended. There is no CVA tenderness.  No rebound, rigidity, or guarding. Musculoskeletal:   Normal range of motion in all extremities. No joint effusions.  No lower extremity tenderness.  No edema. Neurologic:   Normal speech and language.  Motor grossly intact. No acute focal neurologic deficits are appreciated.  Skin:    Skin is warm, dry and intact. No rash noted.  No petechiae, purpura, or bullae.  ____________________________________________    LABS (pertinent positives/negatives) (all labs ordered are listed, but only abnormal results are displayed) Labs Reviewed  COMPREHENSIVE METABOLIC PANEL - Abnormal; Notable for the following components:      Result Value   BUN 21 (*)    All other  components within normal limits  URINALYSIS, COMPLETE (UACMP) WITH MICROSCOPIC - Abnormal; Notable for the following components:   Color, Urine YELLOW (*)    APPearance CLEAR (*)    All other components within normal limits  LIPASE, BLOOD  CBC   ____________________________________________   EKG    ____________________________________________    RADIOLOGY  Ct Abdomen Pelvis W Contrast  Result Date: 03/30/2018 CLINICAL DATA:  Abdomen pain EXAM: CT ABDOMEN AND PELVIS WITH CONTRAST TECHNIQUE: Multidetector CT imaging of the abdomen and pelvis was performed using the standard protocol following bolus administration of intravenous contrast. CONTRAST:  154mL OMNIPAQUE IOHEXOL 300 MG/ML  SOLN COMPARISON:  None. FINDINGS: Lower chest: 5 mm right middle lobe ground-glass nodule. No acute consolidation or effusion. Normal heart size Hepatobiliary: Multiple calcified gallstones. No focal hepatic abnormality. No biliary dilatation. Pancreas: Unremarkable. No pancreatic ductal dilatation or surrounding inflammatory changes. Spleen: Normal in size without focal abnormality. Adrenals/Urinary Tract: Adrenal glands are unremarkable. Kidneys are normal, without renal calculi, focal lesion, or hydronephrosis. Mild increased density at the posterior bladder base. Stomach/Bowel: Stomach is within normal limits. Appendix appears normal. No evidence of bowel wall thickening, distention, or inflammatory changes. Vascular/Lymphatic: Nonaneurysmal aorta.  No significant adenopathy. Reproductive: Small hyperdense focus at the posterior bladder. Suspected slight enlargement of the prostate Other: No abdominal wall hernia or abnormality. No abdominopelvic ascites. Musculoskeletal: Degenerative changes. No acute or suspicious lesion IMPRESSION: 1. No CT evidence for acute intra-abdominal or pelvic abnormality. 2. Gallstones 3. 5 mm right middle lobe ground-glass nodule. No follow-up recommended. This recommendation follows  the consensus statement: Guidelines for Management of Incidental Pulmonary Nodules Detected on CT Images: From the Fleischner Society 2017; Radiology 2017; 284:228-243. 4. Mild hyperdensity at the posterior bladder base, may reflect small amount of hemorrhagic material, debris, or possibly soft tissue. Could correlate with ultrasound. Electronically Signed   By: Donavan Foil M.D.   On: 03/30/2018 21:50    ____________________________________________   PROCEDURES Procedures  ____________________________________________  DIFFERENTIAL DIAGNOSIS   Diverticulitis, colitis, anemia, kidney stone  CLINICAL IMPRESSION / ASSESSMENT AND PLAN / ED COURSE  Pertinent labs & imaging results that were available during my care of the patient were reviewed by me and considered in my medical decision making (see chart for details).    Patient presents with left lower quadrant abdominal pain, persistent for 3 days without improvement.  No other complaints.  Vital signs are normal.  Labs are unremarkable.  Will obtain a CT scan for further evaluation.  Clinical Course as of Mar 30 2245  Fri Mar 30, 2018  2244 CT negative except for cholelithiasis.  Patient will be informed of this finding.  Suitable for outpatient follow-up.   [PS]  Clinical Course User Index [PS] Carrie Mew, MD     ____________________________________________   FINAL CLINICAL IMPRESSION(S) / ED DIAGNOSES    Final diagnoses:  Left lower quadrant pain  Biliary calculus of other site without obstruction     ED Discharge Orders    None      Portions of this note were generated with dragon dictation software. Dictation errors may occur despite best attempts at proofreading.    Carrie Mew, MD 03/30/18 [phone]

## 2018-04-01 ENCOUNTER — Encounter: Payer: Self-pay | Admitting: Surgery

## 2018-04-05 DIAGNOSIS — R1032 Left lower quadrant pain: Secondary | ICD-10-CM | POA: Diagnosis not present

## 2018-04-05 DIAGNOSIS — K8020 Calculus of gallbladder without cholecystitis without obstruction: Secondary | ICD-10-CM | POA: Diagnosis not present

## 2018-04-05 DIAGNOSIS — R911 Solitary pulmonary nodule: Secondary | ICD-10-CM | POA: Diagnosis not present

## 2018-04-12 ENCOUNTER — Ambulatory Visit (INDEPENDENT_AMBULATORY_CARE_PROVIDER_SITE_OTHER): Payer: 59 | Admitting: Urology

## 2018-04-12 ENCOUNTER — Encounter: Payer: Self-pay | Admitting: Urology

## 2018-04-12 VITALS — BP 143/82 | HR 67 | Ht 69.0 in | Wt 203.1 lb

## 2018-04-12 DIAGNOSIS — N400 Enlarged prostate without lower urinary tract symptoms: Secondary | ICD-10-CM

## 2018-04-12 LAB — URINALYSIS, COMPLETE
BILIRUBIN UA: NEGATIVE
Glucose, UA: NEGATIVE
Ketones, UA: NEGATIVE
Leukocytes, UA: NEGATIVE
NITRITE UA: NEGATIVE
PH UA: 7 (ref 5.0–7.5)
Protein, UA: NEGATIVE
RBC UA: NEGATIVE
Specific Gravity, UA: 1.025 (ref 1.005–1.030)
UUROB: 1 mg/dL (ref 0.2–1.0)

## 2018-04-12 LAB — MICROSCOPIC EXAMINATION: RBC MICROSCOPIC, UA: NONE SEEN /HPF (ref 0–2)

## 2018-04-12 NOTE — Progress Notes (Addendum)
04/12/2018 9:42 AM   Finis Bud [id number] [id number]  Referring provider: Deland Pretty, MD [address] [place], [place] [address]  CC: Possible bladder lesion on CT  HPI: I had the pleasure of seeing Mr. Broski in urology clinic today for incidental finding of possible bladder base lesion.  Briefly, he was seen in the emergency department with vague left lower quadrant dull abdominal pain and underwent CT scan with contrast.  His pain has since resolved.  He has mild urinary symptoms which are well controlled with Flomax.  He denies any history of gross hematuria.  There is no history of microscopic hematuria.  He reportedly had an elevated PSA previously, however on recheck this was found to be normal, elevation was attributed to motorcycle riding.  Last PSA was February 2019 and was normal.  He does have a 25-pack-year smoking history, and quit 18 years ago.  He does continue to smoke cigars daily.  He denies family history of prostate cancer.   PMH: Past Medical History:  Diagnosis Date  . Cancer ([place])   . Degenerative disc disease, lumbar   . Hyperlipidemia   . Sleep apnea    CPAP    Surgical History: Past Surgical History:  Procedure Laterality Date  . JOINT REPLACEMENT    . KNEE ARTHROSCOPY Right 08/10/2016   Procedure: ARTHROSCOPY KNEE WITH DEBRIDEMENT,AND PARTIAL MEDIAL MENISCECTOMY, chondroplasty with microfracture of lateral femoral chondral;  Surgeon: Corky Mull, MD;  Location: [facility];  Service: Orthopedics;  Laterality: Right;  sleep apnea  . KNEE SURGERY Left   . TONSILLECTOMY      Home Medications:  Allergies as of 04/12/2018   No Known Allergies     Medication List        Accurate as of 04/12/18  9:42 AM. Always use your most recent med list.          HYDROcodone-acetaminophen 5-325 MG tablet Commonly known as:  NORCO/VICODIN Take 1-2 tablets by mouth every 6 (six) hours as needed for moderate pain. MAXIMUM TOTAL  ACETAMINOPHEN DOSE IS 4000 MG PER DAY   rosuvastatin 10 MG tablet Commonly known as:  CRESTOR Take 10 mg by mouth daily.   tamsulosin 0.4 MG Caps capsule Commonly known as:  FLOMAX Take 0.4 mg by mouth daily.       Allergies: No Known Allergies  Family History: History reviewed. No pertinent family history.  Social History:  reports that he has been smoking cigars. He has never used smokeless tobacco. He reports that he drinks alcohol. He reports that he has current or past drug history.  ROS: Please see flowsheet from today's date for complete review of systems.  Physical Exam: There were no vitals taken for this visit.   Constitutional:  Alert and oriented, No acute distress. Cardiovascular: No clubbing, cyanosis, or edema. Respiratory: Normal respiratory effort, no increased work of breathing. GI: Abdomen is soft, nontender, nondistended, no abdominal masses DRE: Small approximately 20 cc gland.  The left side has subtle firmness however there is no discrete nodule or mass. Lymph: No cervical or inguinal lymphadenopathy. Skin: No rashes, bruises or suspicious lesions. Neurologic: Grossly intact, no focal deficits, moving all 4 extremities. Psychiatric: Normal mood and affect.  Laboratory Data: Lab Results  Component Value Date   WBC 6.5 03/30/2018   HGB 14.8 03/30/2018   HCT 41.5 03/30/2018   MCV 88.0 03/30/2018   PLT 170 03/30/2018    Lab Results  Component Value Date  CREATININE 1.08 03/30/2018   Urinalysis    Component Value Date/Time   COLORURINE YELLOW (A) 03/30/2018 1535   APPEARANCEUR CLEAR (A) 03/30/2018 1535   LABSPEC 1.013 03/30/2018 1535   PHURINE 5.0 03/30/2018 1535   GLUCOSEU NEGATIVE 03/30/2018 1535   HGBUR NEGATIVE 03/30/2018 1535   BILIRUBINUR NEGATIVE 03/30/2018 1535   KETONESUR NEGATIVE 03/30/2018 1535   PROTEINUR NEGATIVE 03/30/2018 1535   NITRITE NEGATIVE 03/30/2018 1535   LEUKOCYTESUR NEGATIVE 03/30/2018 1535    Lab Results    Component Value Date   BACTERIA NONE SEEN 03/30/2018    Pertinent Imaging: I have personally reviewed the CT abdomen pelvis with contrast from 03/30/2018.  This is notable for a subtle area of hyperenhancement at the base of the bladder, likely consistent with a prostatic median lobe.  There are no renal masses, hydronephrosis, or urolithiasis.  Assessment & Plan:   In summary, Mr. Lesinski is a healthy 57 year old male with a 25-pack-year smoking history who presents for evaluation of possible bladder base lesion incidentally found on CT scan.  He has mild urinary symptoms well controlled on Flomax.  On my review of the scan this lesion is likely consistent with the median lobe of the prostate protruding into the bladder.  With his 25-pack-year history, and CT findings, I did recommend clinic cystoscopy to rule out any malignant bladder or prostatic lesion.  I counseled him extensively that we could not rule out malignancy without cystoscopy.  He refused cystoscopy at this time.  He is agreeable to following up in 6 months with another urinalysis to look for hematuria, as well as a repeat PSA.  1.  Possible bladder lesion, BPH Continue Flomax. We discussed surgical bladder outlet options if his symptoms were to worsen in the future Follow-up in 6 months with IPSS, PSA, urinalysis  Billey Co, MD  [facility] [address], [place], [place] [address] [phone]

## 2018-04-12 NOTE — Patient Instructions (Signed)
Benign Prostatic Hyperplasia  Benign prostatic hyperplasia (BPH) is an enlarged prostate gland that is caused by the normal aging process and not by cancer. The prostate is a walnut-sized gland that is involved in the production of semen. It is located in front of the rectum and below the bladder. The bladder stores urine and the urethra is the tube that carries the urine out of the body. The prostate may get bigger as a man gets older.  An enlarged prostate can press on the urethra. This can make it harder to pass urine. The build-up of urine in the bladder can cause infection. Back pressure and infection may progress to bladder damage and kidney (renal) failure.  What are the causes?  This condition is part of a normal aging process. However, not all men develop problems from this condition. If the prostate enlarges away from the urethra, urine flow will not be blocked. If it enlarges toward the urethra and compresses it, there will be problems passing urine.  What increases the risk?  This condition is more likely to develop in men over the age of 50 years.  What are the signs or symptoms?  Symptoms of this condition include:  · Getting up often during the night to urinate.  · Needing to urinate frequently during the day.  · Difficulty starting urine flow.  · Decrease in size and strength of your urine stream.  · Leaking (dribbling) after urinating.  · Inability to pass urine. This needs immediate treatment.  · Inability to completely empty your bladder.  · Pain when you pass urine. This is more common if there is also an infection.  · Urinary tract infection (UTI).    How is this diagnosed?  This condition is diagnosed based on your medical history, a physical exam, and your symptoms. Tests will also be done, such as:  · A post-void bladder scan. This measures any amount of urine that may remain in your bladder after you finish urinating.  · A digital rectal exam. In a rectal exam, your health care provider  checks your prostate by putting a lubricated, gloved finger into your rectum to feel the back of your prostate gland. This exam detects the size of your gland and any abnormal lumps or growths.  · An exam of your urine (urinalysis).  · A prostate specific antigen (PSA) screening. This is a blood test used to screen for prostate cancer.  · An ultrasound. This test uses sound waves to electronically produce a picture of your prostate gland.    Your health care provider may refer you to a specialist in kidney and prostate diseases (urologist).  How is this treated?  Once symptoms begin, your health care provider will monitor your condition (active surveillance or watchful waiting). Treatment for this condition will depend on the severity of your condition. Treatment may include:  · Observation and yearly exams. This may be the only treatment needed if your condition and symptoms are mild.  · Medicines to relieve your symptoms, including:  ? Medicines to shrink the prostate.  ? Medicines to relax the muscle of the prostate.  · Surgery in severe cases. Surgery may include:  ? Prostatectomy. In this procedure, the prostate tissue is removed completely through an open incision or with a laparascope or robotics.  ? Transurethral resection of the prostate (TURP). In this procedure, a tool is inserted through the opening at the tip of the penis (urethra). It is used to cut away tissue of   the inner core of the prostate. The pieces are removed through the same opening of the penis. This removes the blockage.  ? Transurethral incision (TUIP). In this procedure, small cuts are made in the prostate. This lessens the prostate's pressure on the urethra.  ? Transurethral microwave thermotherapy (TUMT). This procedure uses microwaves to create heat. The heat destroys and removes a small amount of prostate tissue.  ? Transurethral needle ablation (TUNA). This procedure uses radio frequencies to destroy and remove a small amount of  prostate tissue.  ? Interstitial laser coagulation (ILC). This procedure uses a laser to destroy and remove a small amount of prostate tissue.  ? Transurethral electrovaporization (TUVP). This procedure uses electrodes to destroy and remove a small amount of prostate tissue.  ? Prostatic urethral lift. This procedure inserts an implant to push the lobes of the prostate away from the urethra.    Follow these instructions at home:  · Take over-the-counter and prescription medicines only as told by your health care provider.  · Monitor your symptoms for any changes. Contact your health care provider with any changes.  · Avoid drinking large amounts of liquid before going to bed or out in public.  · Avoid or reduce how much caffeine or alcohol you drink.  · Give yourself time when you urinate.  · Keep all follow-up visits as told by your health care provider. This is important.  Contact a health care provider if:  · You have unexplained back pain.  · Your symptoms do not get better with treatment.  · You develop side effects from the medicine you are taking.  · Your urine becomes very dark or has a bad smell.  · Your lower abdomen becomes distended and you have trouble passing your urine.  Get help right away if:  · You have a fever or chills.  · You suddenly cannot urinate.  · You feel lightheaded, or very dizzy, or you faint.  · There are large amounts of blood or clots in the urine.  · Your urinary problems become hard to manage.  · You develop moderate to severe low back or flank pain. The flank is the side of your body between the ribs and the hip.  These symptoms may represent a serious problem that is an emergency. Do not wait to see if the symptoms will go away. Get medical help right away. Call your local emergency services (911 in the [place] Do not drive yourself to the hospital.  Summary  · Benign prostatic hyperplasia (BPH) is an enlarged prostate that is caused by the normal aging process and not by  cancer.  · An enlarged prostate can press on the urethra. This can make it hard to pass urine.  · This condition is part of a normal aging process and is more likely to develop in men over the age of 50 years.  · Get help right away if you suddenly cannot urinate.  This information is not intended to replace advice given to you by your health care provider. Make sure you discuss any questions you have with your health care provider.  Document Released: 08/15/2005 Document Revised: 09/19/2016 Document Reviewed: 09/19/2016  Elsevier Interactive Patient Education © 2018 [employer]

## 2018-04-19 ENCOUNTER — Ambulatory Visit: Payer: Self-pay | Admitting: Urology

## 2018-06-26 DIAGNOSIS — C44311 Basal cell carcinoma of skin of nose: Secondary | ICD-10-CM | POA: Diagnosis not present

## 2018-06-26 DIAGNOSIS — D485 Neoplasm of uncertain behavior of skin: Secondary | ICD-10-CM | POA: Diagnosis not present

## 2018-06-26 DIAGNOSIS — Z85828 Personal history of other malignant neoplasm of skin: Secondary | ICD-10-CM | POA: Diagnosis not present

## 2018-06-26 DIAGNOSIS — Z08 Encounter for follow-up examination after completed treatment for malignant neoplasm: Secondary | ICD-10-CM | POA: Diagnosis not present

## 2018-08-28 DIAGNOSIS — Z85828 Personal history of other malignant neoplasm of skin: Secondary | ICD-10-CM | POA: Diagnosis not present

## 2018-08-28 DIAGNOSIS — L570 Actinic keratosis: Secondary | ICD-10-CM | POA: Diagnosis not present

## 2018-08-28 DIAGNOSIS — C44311 Basal cell carcinoma of skin of nose: Secondary | ICD-10-CM | POA: Diagnosis not present

## 2018-10-11 ENCOUNTER — Other Ambulatory Visit: Payer: 59

## 2018-10-15 ENCOUNTER — Ambulatory Visit: Payer: 59 | Admitting: Urology

## 2018-11-13 DIAGNOSIS — Z1159 Encounter for screening for other viral diseases: Secondary | ICD-10-CM | POA: Diagnosis not present

## 2018-11-13 DIAGNOSIS — Z125 Encounter for screening for malignant neoplasm of prostate: Secondary | ICD-10-CM | POA: Diagnosis not present

## 2018-11-13 DIAGNOSIS — Z0000 Encounter for general adult medical examination without abnormal findings: Secondary | ICD-10-CM | POA: Diagnosis not present

## 2019-04-24 ENCOUNTER — Other Ambulatory Visit: Payer: Self-pay | Admitting: Internal Medicine

## 2019-04-24 ENCOUNTER — Encounter: Payer: Self-pay | Admitting: Specialist

## 2019-04-24 ENCOUNTER — Ambulatory Visit: Payer: Self-pay

## 2019-04-24 ENCOUNTER — Ambulatory Visit (INDEPENDENT_AMBULATORY_CARE_PROVIDER_SITE_OTHER): Payer: 59 | Admitting: Specialist

## 2019-04-24 VITALS — BP 139/91 | HR 86 | Ht 69.0 in | Wt 200.0 lb

## 2019-04-24 DIAGNOSIS — Z8781 Personal history of (healed) traumatic fracture: Secondary | ICD-10-CM

## 2019-04-24 DIAGNOSIS — M48062 Spinal stenosis, lumbar region with neurogenic claudication: Secondary | ICD-10-CM

## 2019-04-24 DIAGNOSIS — M5442 Lumbago with sciatica, left side: Secondary | ICD-10-CM

## 2019-04-24 DIAGNOSIS — M5432 Sciatica, left side: Secondary | ICD-10-CM

## 2019-04-24 MED ORDER — GABAPENTIN 300 MG PO CAPS: 300.0000 mg | ORAL_CAPSULE | Freq: Every day | ORAL | 1 refills | Status: DC

## 2019-04-24 MED ORDER — DICLOFENAC SODIUM 50 MG PO TBEC: 50.0000 mg | DELAYED_RELEASE_TABLET | Freq: Three times a day (TID) | ORAL | 0 refills | Status: DC

## 2019-04-24 NOTE — Patient Instructions (Signed)
Avoid bending, stooping and avoid lifting weights greater than 10 lbs. Avoid prolong standing and walking. Avoid frequent bending and stooping  No lifting greater than 10 lbs. May use ice or moist heat for pain. Weight loss is of benefit. Handicap license is approved. Dr. Newton's secretary/Assistant will call to arrange for epidural steroid injection  

## 2019-04-24 NOTE — Progress Notes (Signed)
Office Visit Note   Patient: Tommy Small           Date of Birth: Sep 26, 1960           MRN: [id number] Visit Date: 04/24/2019              Requested by: Deland Pretty, MD [address] [place],  [place] [address] PCP: Deland Pretty, MD   Assessment & Plan: Visit Diagnoses:  1. Left-sided low back pain with left-sided sciatica, unspecified chronicity   2. Spinal stenosis of lumbar region with neurogenic claudication     Plan: Avoid bending, stooping and avoid lifting weights greater than 10 lbs. Avoid prolong standing and walking. Avoid frequent bending and stooping  No lifting greater than 10 lbs. May use ice or moist heat for pain. Weight loss is of benefit. Handicap license is approved. Dr. Romona Curls secretary/Assistant will call to arrange for epidural steroid injection   Follow-Up Instructions: Return in about 3 weeks (around 05/15/2019).   Orders:  Orders Placed This Encounter  Procedures  . XR Lumbar Spine 2-3 Views  . Ambulatory referral to Physical Medicine Rehab   Meds ordered this encounter  Medications  . gabapentin (NEURONTIN) 300 MG capsule    Sig: Take 1 capsule (300 mg total) by mouth at bedtime.    Dispense:  30 capsule    Refill:  1      Procedures: No procedures performed   Clinical Data: No additional findings.   Subjective: Chief Complaint  Patient presents with  . Lower Back - Pain    58 year old male with a several year history of back pain. He has had 2 week history of increased back and left leg pain. He feels better with sitting and better with leaning of carts and stooping. Played 2 weeks ago and played 3 times a week for the 2-3 weeks prior to that. Pain is occasionally associated with tingling in the left leg.    Review of Systems  Constitutional: Positive for activity change. Negative for appetite change, chills, diaphoresis, fatigue, fever and unexpected weight change.  HENT: Negative.   Eyes: Negative.    Respiratory: Negative.   Cardiovascular: Negative.   Endocrine: Negative.   Genitourinary: Negative.   Musculoskeletal: Positive for back pain and gait problem. Negative for arthralgias.  Skin: Negative.   Allergic/Immunologic: Negative.   Neurological: Positive for weakness and numbness.  Hematological: Negative.   Psychiatric/Behavioral: Negative.  Negative for agitation, behavioral problems, confusion, decreased concentration, dysphoric mood, hallucinations, self-injury, sleep disturbance and suicidal ideas. The patient is not nervous/anxious and is not hyperactive.      Objective: Vital Signs: BP (!) 139/91 (BP Location: Left Arm, Patient Position: Sitting)   Pulse 86   Ht 5\' 9"  (1.753 m)   Wt 200 lb (90.7 kg)   BMI 29.53 kg/m   Physical Exam  Ortho Exam  Specialty Comments:  No specialty comments available.  Imaging: No results found.   PMFS History: There are no active problems to display for this patient.  Past Medical History:  Diagnosis Date  . Cancer ([place])   . Degenerative disc disease, lumbar   . Hyperlipidemia   . Sleep apnea    CPAP    Family History  Problem Relation Age of Onset  . Kidney cancer Maternal Grandmother   . Prostate cancer Neg Hx   . Bladder Cancer Neg Hx     Past Surgical History:  Procedure Laterality Date  . JOINT REPLACEMENT    .  KNEE ARTHROSCOPY Right 08/10/2016   Procedure: ARTHROSCOPY KNEE WITH DEBRIDEMENT,AND PARTIAL MEDIAL MENISCECTOMY, chondroplasty with microfracture of lateral femoral chondral;  Surgeon: Corky Mull, MD;  Location: [facility];  Service: Orthopedics;  Laterality: Right;  sleep apnea  . KNEE SURGERY Left   . TONSILLECTOMY     Social History   Occupational History  . Not on file  Tobacco Use  . Smoking status: Current Every Day Smoker    Types: Cigars  . Smokeless tobacco: Never Used  . Tobacco comment: 3-4 cigars/week  Substance and Sexual Activity  . Alcohol use: Yes    Comment: Occ   . Drug use: Not Currently  . Sexual activity: Not on file

## 2019-04-24 NOTE — Addendum Note (Signed)
Addended by: Basil Dess on: 04/24/2019 01:44 PM   Modules accepted: Orders

## 2019-05-13 ENCOUNTER — Ambulatory Visit: Payer: Self-pay

## 2019-05-13 ENCOUNTER — Other Ambulatory Visit: Payer: Self-pay

## 2019-05-13 ENCOUNTER — Encounter: Payer: Self-pay | Admitting: Physical Medicine and Rehabilitation

## 2019-05-13 ENCOUNTER — Encounter

## 2019-05-13 ENCOUNTER — Ambulatory Visit (INDEPENDENT_AMBULATORY_CARE_PROVIDER_SITE_OTHER): Payer: 59 | Admitting: Physical Medicine and Rehabilitation

## 2019-05-13 VITALS — BP 155/101 | HR 62

## 2019-05-13 DIAGNOSIS — M5416 Radiculopathy, lumbar region: Secondary | ICD-10-CM | POA: Diagnosis not present

## 2019-05-13 MED ORDER — METHYLPREDNISOLONE ACETATE 80 MG/ML IJ SUSP
80.0000 mg | Freq: Once | INTRAMUSCULAR | Status: AC
  Administered 2019-05-13: 80 mg

## 2019-05-13 NOTE — Procedures (Signed)
Lumbar Epidural Steroid Injection - Interlaminar Approach with Fluoroscopic Guidance  Patient: Tommy Small      Date of Birth: 12/31/1960 MRN: [id number] PCP: Deland Pretty, MD      Visit Date: 05/13/2019   Universal Protocol:     Consent Given By: the patient  Position: PRONE  Additional Comments: Vital signs were monitored before and after the procedure. Patient was prepped and draped in the usual sterile fashion. The correct patient, procedure, and site was verified.   Injection Procedure Details:  Procedure Site One Meds Administered:  Meds ordered this encounter  Medications  . methylPREDNISolone acetate (DEPO-MEDROL) injection 80 mg     Laterality: Left  Location/Site:  L4-L5  Needle size: 20 G  Needle type: Tuohy  Needle Placement: Paramedian epidural  Findings:   -Comments: Excellent flow of contrast into the epidural space.  Component initial loss of resistance the patient did have initial abrupt onset of a brief amount of pain on the left side more in the back and hip area.  He was getting a little bit of referral in the leg.  Biplanar imaging did show the needle placed somewhat in the gutter to the left side and I think there is just more narrowing here than shown on the CT scan.  Flow of contrast was excellent on AP and did show some anterior spread on the lateral pictures but did not appear to be dural.  There was no flow of spinal fluid with aspiration.  Injection went fairly well and patient was discharged without any real pain.  Procedure Details: Using a paramedian approach from the side mentioned above, the region overlying the inferior lamina was localized under fluoroscopic visualization and the soft tissues overlying this structure were infiltrated with 4 ml. of 1% Lidocaine without Epinephrine. The Tuohy needle was inserted into the epidural space using a paramedian approach.   The epidural space was localized using loss of resistance along with  lateral and bi-planar fluoroscopic views.  After negative aspirate for air, blood, and CSF, a 2 ml. volume of Isovue-250 was injected into the epidural space and the flow of contrast was observed. Radiographs were obtained for documentation purposes.    The injectate was administered into the level noted above.   Additional Comments:  The patient tolerated the procedure well. Dressing: 2 x 2 sterile gauze and Band-Aid    Post-procedure details: Patient was observed during the procedure. Post-procedure instructions were reviewed.  Patient left the clinic in stable condition.

## 2019-05-13 NOTE — Progress Notes (Signed)
Numeric Pain Rating Scale and Functional Assessment Average Pain 9   In the last MONTH (on 0-10 scale) has pain interfered with the following?  1. General activity like being  able to carry out your everyday physical activities such as walking, climbing stairs, carrying groceries, or moving a chair?  Rating(7)   +Driver, -BT, -Dye Allergies. 

## 2019-05-13 NOTE — Progress Notes (Signed)
Tommy Small - 58 y.o. male MRN [id number]  Date of birth: 1961/02/12  Office Visit Note: Visit Date: 05/13/2019 PCP: Deland Pretty, MD Referred by: Deland Pretty, MD  Subjective: Chief Complaint  Patient presents with  . Lower Back - Pain  . Left Hip - Pain   HPI: Tommy Small is a 58 y.o. male who comes in today At the request of Dr. Basil Dess for left L4-5 interlaminar epidural steroid injection.  Patient is been having off-and-on back pain but more recently several weeks of severe left radicular leg pain with paresthesia and a pretty classic L5 distribution to the top of the foot.  No right-sided complaints.  No focal weakness.  No red flag complaints.  Dr. Louanne Skye did look at CT scan from 2019 although he referenced the CT scan as being from 2020.  I also did look at the CT scan dated 03/2019.  There is a vacuum sign anterior and the lateral recess at L4-5 with likely lateral recess narrowing.  We will complete diagnostic and hopefully therapeutic left and L4-5 interlaminar injection.  Depending on relief would suggest MRI of the lumbar spine and potential transforaminal injection depending on those results.  ROS Otherwise per HPI.  Assessment & Plan: Visit Diagnoses:  1. Lumbar radiculopathy     Plan: No additional findings.   Meds & Orders:  Meds ordered this encounter  Medications  . methylPREDNISolone acetate (DEPO-MEDROL) injection 80 mg    Orders Placed This Encounter  Procedures  . XR C-ARM NO REPORT  . Epidural Steroid injection    Follow-up: Return for Basil Dess, M.D..   Procedures: No procedures performed  Lumbar Epidural Steroid Injection - Interlaminar Approach with Fluoroscopic Guidance  Patient: Tommy Small      Date of Birth: Jan 02, 1961 MRN: [id number] PCP: Deland Pretty, MD      Visit Date: 05/13/2019   Universal Protocol:     Consent Given By: the patient  Position: PRONE  Additional Comments: Vital signs were monitored before and after  the procedure. Patient was prepped and draped in the usual sterile fashion. The correct patient, procedure, and site was verified.   Injection Procedure Details:  Procedure Site One Meds Administered:  Meds ordered this encounter  Medications  . methylPREDNISolone acetate (DEPO-MEDROL) injection 80 mg     Laterality: Left  Location/Site:  L4-L5  Needle size: 20 G  Needle type: Tuohy  Needle Placement: Paramedian epidural  Findings:   -Comments: Excellent flow of contrast into the epidural space.  Component initial loss of resistance the patient did have initial abrupt onset of a brief amount of pain on the left side more in the back and hip area.  He was getting a little bit of referral in the leg.  Biplanar imaging did show the needle placed somewhat in the gutter to the left side and I think there is just more narrowing here than shown on the CT scan.  Flow of contrast was excellent on AP and did show some anterior spread on the lateral pictures but did not appear to be dural.  There was no flow of spinal fluid with aspiration.  Injection went fairly well and patient was discharged without any real pain.  Procedure Details: Using a paramedian approach from the side mentioned above, the region overlying the inferior lamina was localized under fluoroscopic visualization and the soft tissues overlying this structure were infiltrated with 4 ml. of 1% Lidocaine without Epinephrine. The Tuohy needle was inserted  into the epidural space using a paramedian approach.   The epidural space was localized using loss of resistance along with lateral and bi-planar fluoroscopic views.  After negative aspirate for air, blood, and CSF, a 2 ml. volume of Isovue-250 was injected into the epidural space and the flow of contrast was observed. Radiographs were obtained for documentation purposes.    The injectate was administered into the level noted above.   Additional Comments:  The patient  tolerated the procedure well. Dressing: 2 x 2 sterile gauze and Band-Aid    Post-procedure details: Patient was observed during the procedure. Post-procedure instructions were reviewed.  Patient left the clinic in stable condition.    Clinical History: No specialty comments available.   He reports that he has been smoking cigars. He has never used smokeless tobacco. No results for input(s): HGBA1C, LABURIC in the last 8760 hours.  Objective:  VS:  HT:    WT:   BMI:     BP:(!) 155/101  HR:62bpm  TEMP: ( )  RESP:  Physical Exam  Ortho Exam Imaging: No results found.  Past Medical/Family/Surgical/Social History: Medications & Allergies reviewed per EMR, new medications updated. There are no active problems to display for this patient.  Past Medical History:  Diagnosis Date  . Cancer ([place])   . Degenerative disc disease, lumbar   . Hyperlipidemia   . Sleep apnea    CPAP   Family History  Problem Relation Age of Onset  . Kidney cancer Maternal Grandmother   . Prostate cancer Neg Hx   . Bladder Cancer Neg Hx    Past Surgical History:  Procedure Laterality Date  . JOINT REPLACEMENT    . KNEE ARTHROSCOPY Right 08/10/2016   Procedure: ARTHROSCOPY KNEE WITH DEBRIDEMENT,AND PARTIAL MEDIAL MENISCECTOMY, chondroplasty with microfracture of lateral femoral chondral;  Surgeon: Corky Mull, MD;  Location: [facility];  Service: Orthopedics;  Laterality: Right;  sleep apnea  . KNEE SURGERY Left   . TONSILLECTOMY     Social History   Occupational History  . Not on file  Tobacco Use  . Smoking status: Current Every Day Smoker    Types: Cigars  . Smokeless tobacco: Never Used  . Tobacco comment: 3-4 cigars/week  Substance and Sexual Activity  . Alcohol use: Yes    Comment: Occ  . Drug use: Not Currently  . Sexual activity: Not on file

## 2019-05-16 ENCOUNTER — Telehealth: Payer: Self-pay | Admitting: Radiology

## 2019-05-16 ENCOUNTER — Ambulatory Visit: Payer: 59 | Admitting: Specialist

## 2019-05-16 NOTE — Telephone Encounter (Signed)
FYI  Patient came in for scheduled appointment today. He is status post lumbar ESI on 05/13/2019.  Patient states that he was approximately 70% better prior to injection and that he has received 10-15% more relief so far after injection. He was concerned with wait time today. We scheduled patient follow up appt post injection for 06/07/2019 to see if he continues to improve.  He will call if he needs anything before then.

## 2019-05-17 NOTE — Telephone Encounter (Signed)
noted 

## 2019-06-07 ENCOUNTER — Ambulatory Visit: Payer: 59 | Admitting: Specialist

## 2021-04-21 ENCOUNTER — Other Ambulatory Visit: Payer: Self-pay | Admitting: Internal Medicine

## 2021-04-21 DIAGNOSIS — E7800 Pure hypercholesterolemia, unspecified: Secondary | ICD-10-CM

## 2021-04-29 DIAGNOSIS — R931 Abnormal findings on diagnostic imaging of heart and coronary circulation: Secondary | ICD-10-CM

## 2021-04-29 HISTORY — DX: Abnormal findings on diagnostic imaging of heart and coronary circulation: R93.1

## 2021-05-11 ENCOUNTER — Ambulatory Visit
Admission: RE | Admit: 2021-05-11 | Discharge: 2021-05-11 | Disposition: A | Discharge status: Home / Self Care | Payer: No Typology Code available for payment source | Source: Ambulatory Visit | Attending: Internal Medicine | Admitting: Internal Medicine

## 2021-05-11 DIAGNOSIS — E7800 Pure hypercholesterolemia, unspecified: Secondary | ICD-10-CM

## 2022-02-17 ENCOUNTER — Other Ambulatory Visit: Payer: Self-pay | Admitting: Internal Medicine

## 2022-02-17 DIAGNOSIS — R911 Solitary pulmonary nodule: Secondary | ICD-10-CM

## 2022-04-07 ENCOUNTER — Ambulatory Visit
Admission: RE | Admit: 2022-04-07 | Discharge: 2022-04-07 | Disposition: A | Discharge status: Home / Self Care | Payer: Managed Care, Other (non HMO) | Source: Ambulatory Visit | Attending: Internal Medicine | Admitting: Internal Medicine

## 2022-04-07 DIAGNOSIS — R911 Solitary pulmonary nodule: Secondary | ICD-10-CM

## 2022-05-16 LAB — COLOGUARD: COLOGUARD: NEGATIVE

## 2022-07-01 ENCOUNTER — Telehealth: Payer: Self-pay

## 2022-07-01 NOTE — Telephone Encounter (Signed)
Spoke to patient and requested that he bring SD card to 07/04/2022 visit. Lm for medical records department at Dr. Pennie Banter office to request copy of sleep study.

## 2022-07-04 ENCOUNTER — Ambulatory Visit (INDEPENDENT_AMBULATORY_CARE_PROVIDER_SITE_OTHER): Payer: Managed Care, Other (non HMO) | Admitting: Pulmonary Disease

## 2022-07-04 ENCOUNTER — Telehealth: Payer: Self-pay

## 2022-07-04 ENCOUNTER — Encounter: Payer: Self-pay | Admitting: Pulmonary Disease

## 2022-07-04 VITALS — BP 122/80 | HR 67 | Temp 97.7°F | Ht 69.0 in | Wt 180.6 lb

## 2022-07-04 DIAGNOSIS — F5101 Primary insomnia: Secondary | ICD-10-CM | POA: Diagnosis not present

## 2022-07-04 DIAGNOSIS — G4733 Obstructive sleep apnea (adult) (pediatric): Secondary | ICD-10-CM

## 2022-07-04 NOTE — Progress Notes (Signed)
[facility]  Chief Complaint  Patient presents with   sleep consult    Wearing cpap nightly--mask is leaking. Pressure is okay.     Past Surgical History:  He  has a past surgical history that includes Tonsillectomy; Knee surgery (Left); Knee arthroscopy (Right, 08/10/2016); and Joint replacement.  Past Medical History:  HLD, COVID 19 infection in 2020, OA, BPH, Coronary atherosclerosis  Constitutional:  BP 122/80 (BP Location: Left Arm, Cuff Size: Normal)   Pulse 67   Temp 97.7 F (36.5 C) (Temporal)   Ht '5\' 9"'$  (1.753 m)   Wt 180 lb 9.6 oz (81.9 kg)   SpO2 96%   BMI 26.67 kg/m   Brief Summary:  Tommy Small is a 61 y.o. male cigar smoker with obstructive sleep apnea and insomnia.      Subjective:   He had a sleep study several years  ago.  He was found to have sleep apnea.  He has been using CPAP on a regular basis.  He has not had contact with his DME for several years.  He was using [facility], but would want a DME closer to [facility].  No issues with mask fit.  He needs new supplies.  He says he can use his wife's CPAP if his no longer works.  He goes to sleep at 10 pm.  He falls asleep after about 30 minutes.  He gets out of bed at 5 am.  He feels okay in the morning.  He denies morning headache.  He does not use anything to help him stay awake.  He has trouble unwinding at night.  He watches TV in bed.  He uses trazodone and this helps him fall asleep.  He denies sleep walking, sleep talking, bruxism, or nightmares.  There is no history of restless legs.  He denies sleep hallucinations, sleep paralysis, or cataplexy.  The Epworth score is 0 out of 24.   Physical Exam:   Appearance - well kempt   ENMT - no sinus tenderness, no oral exudate, no LAN, Mallampati 3 airway, no stridor  Respiratory - equal breath sounds bilaterally, no wheezing or rales  CV - s1s2 regular rate and rhythm, no murmurs  Ext - no  clubbing, no edema  Skin - no rashes  Psych - normal mood and affect   Sleep Tests:  CPAP 06/04/22 to 07/03/22 >> used on 30 of 30 nights with average 8 hrs 2 min.  Average AHI 2 with CPAP 10 cm H2O  Social History:  He  reports that he has been smoking cigars. He has never used smokeless tobacco. He reports current alcohol use. He reports that he does not currently use drugs.  Family History:  His family history includes Kidney cancer in his maternal grandmother.     Assessment/Plan:   Obstructive sleep apnea. - he is compliant with CPAP and reports benefit from therapy - discussed how sleep apnea can impact his health - treatment options discussed - continue CPAP 10 cm H2O - will arrange for new DME to get supplies; if his out of pocket cost is too much, then he might purchase supplies on his own - his wife has a relatively new CPAP machine that she doesn't use; he would switch to this when his current device no longer works   Insomnia. - discussed sleep restriction, stimulus control and relaxation techniques - he gets trazodone refilled by his PCP  Time Spent Involved in Patient Care  on Day of Examination:  50 minutes  Follow up:   Patient Instructions  Will try to get a copy of your previous sleep study  Will arrange for referral to a medical supply company to get a new CPAP mask and supplies.   Follow up in 1 year.  Medication List:   Allergies as of 07/04/2022   No Known Allergies      Medication List        Accurate as of July 04, 2022 10:41 AM. If you have any questions, ask your nurse or doctor.          diclofenac 50 MG EC tablet Commonly known as: VOLTAREN Take 1 tablet (50 mg total) by mouth 3 (three) times daily.   gabapentin 300 MG capsule Commonly known as: NEURONTIN Take 1 capsule (300 mg total) by mouth at bedtime.   rosuvastatin 10 MG tablet Commonly known as: CRESTOR Take 10 mg by mouth daily.   tamsulosin 0.4 MG Caps  capsule Commonly known as: FLOMAX Take 0.4 mg by mouth daily.        Signature:  Chesley Mires, MD [place] Pager - [phone] 07/04/2022, 10:41 AM

## 2022-07-04 NOTE — Telephone Encounter (Signed)
Lm for referral coordinator with Dr. Pennie Banter office to request copy of sleep study.

## 2022-07-04 NOTE — Patient Instructions (Signed)
Will try to get a copy of your previous sleep study  Will arrange for referral to a medical supply company to get a new CPAP mask and supplies.   Follow up in 1 year.

## 2022-07-04 NOTE — Telephone Encounter (Addendum)
Sleep study has received and faxed to Dr. Halford Chessman in [facility] for review.

## 2022-07-11 NOTE — Telephone Encounter (Signed)
Sleep study has been faxed to [facility] office.

## 2022-07-11 NOTE — Telephone Encounter (Signed)
Lm x1 for patient.  

## 2022-07-11 NOTE — Telephone Encounter (Signed)
Patient is aware of results and voiced his understanding.  Nothing further needed.

## 2022-07-11 NOTE — Telephone Encounter (Signed)
I have not received these.  I am in the [place] office this week.

## 2022-07-11 NOTE — Telephone Encounter (Signed)
Please let him know I have reviewed his sleep study results.  PSG from 08/06/06 showed severe obstructive sleep apnea with an RDI of 44.4 and SpO2 low of 75%.  CPAP titration study from 08/31/05 showed optimal pressure of 9 cm H2O.

## 2022-07-11 NOTE — Telephone Encounter (Signed)
Dr. Halford Chessman, please advise if you received sleep study? Thanks

## 2023-05-08 ENCOUNTER — Encounter: Payer: Self-pay | Admitting: *Deleted

## 2023-07-03 ENCOUNTER — Ambulatory Visit: Payer: No Typology Code available for payment source | Admitting: Cardiology

## 2023-08-21 ENCOUNTER — Ambulatory Visit
Admission: EM | Admit: 2023-08-21 | Discharge: 2023-08-21 | Disposition: A | Discharge status: Home / Self Care | Payer: Managed Care, Other (non HMO) | Attending: Family Medicine | Admitting: Family Medicine

## 2023-08-21 ENCOUNTER — Ambulatory Visit (INDEPENDENT_AMBULATORY_CARE_PROVIDER_SITE_OTHER): Payer: Managed Care, Other (non HMO)

## 2023-08-21 DIAGNOSIS — J101 Influenza due to other identified influenza virus with other respiratory manifestations: Secondary | ICD-10-CM | POA: Diagnosis not present

## 2023-08-21 DIAGNOSIS — R509 Fever, unspecified: Secondary | ICD-10-CM | POA: Insufficient documentation

## 2023-08-21 DIAGNOSIS — J989 Respiratory disorder, unspecified: Secondary | ICD-10-CM

## 2023-08-21 LAB — RESP PANEL BY RT-PCR (RSV, FLU A&B, COVID)  RVPGX2
Influenza A by PCR: POSITIVE — AB
Influenza B by PCR: NEGATIVE
Resp Syncytial Virus by PCR: NEGATIVE
SARS Coronavirus 2 by RT PCR: NEGATIVE

## 2023-08-21 MED ORDER — PROMETHAZINE-DM 6.25-15 MG/5ML PO SYRP: 5.0000 mL | ORAL_SOLUTION | Freq: Four times a day (QID) | ORAL | 0 refills | Status: DC | PRN

## 2023-08-21 MED ORDER — IBUPROFEN 800 MG PO TABS
800.0000 mg | ORAL_TABLET | Freq: Once | ORAL | Status: AC
  Administered 2023-08-21: 800 mg via ORAL

## 2023-08-21 MED ORDER — OSELTAMIVIR PHOSPHATE 75 MG PO CAPS: 75.0000 mg | ORAL_CAPSULE | Freq: Two times a day (BID) | ORAL | 0 refills | Status: DC

## 2023-08-21 NOTE — Discharge Instructions (Addendum)
You have the flu.  Your COVID and RSV test were negative.  Your chest x-ray did not show evidence of a pneumonia.  If the radiologist finds something that I did not, I will call you.   I sent a cough medication and Tamiflu to your pharmacy.  Stop by [facility] and pick them up.   You can take Tylenol and/or Ibuprofen as needed for fever reduction and pain relief.    For cough: use the prescription cough syrup. You can use a humidifier for chest congestion and cough.  If you don't have a humidifier, you can sit in the bathroom with the hot shower running.      For sore throat: try warm salt water gargles, Mucinex sore throat cough drops or cepacol lozenges, throat spray, warm tea or water with lemon/honey, popsicles or ice, or OTC cold relief medicine for throat discomfort. You can also purchase chloraseptic spray at the pharmacy or dollar store.   For congestion: take a daily anti-histamine like Zyrtec, Claritin, and a oral decongestant, such as pseudoephedrine.  You can also use Flonase 1-2 sprays in each nostril daily. Afrin is also a good option, if you do not have high blood pressure.    It is important to stay hydrated: drink plenty of fluids (water, gatorade/powerade/pedialyte, juices, or teas) to keep your throat moisturized and help further relieve irritation/discomfort.    Return or go to the Emergency Department if symptoms worsen or do not improve in the next few days

## 2023-08-21 NOTE — ED Triage Notes (Addendum)
Pt present coughing with fever. Symptom started yesterday. Pt states his cough is non productive. Pt took two tylenol two hours ago.

## 2023-08-21 NOTE — ED Provider Notes (Signed)
[facility]    CSN: [id number] Arrival date & time: 08/21/23  1727      History   Chief Complaint Chief Complaint  Patient presents with   Fever   Cough    HPI Tommy Small is a 62 y.o. male.   HPI  History obtained from the patient. Tommy Small presents for fever and dry cough that started yesterday. He took Tylenol and Mucinex about 2 hours ago. Took NyQuil last night. His wife is not feeling good but she doesn't have a fever. No vomiting, diarrhea or headache.  Endorses rhinorrhea and nasal congestion.   No history of asthma. He former smoker and quit in 2001.      Past Medical History:  Diagnosis Date   Cancer ([facility])    Degenerative disc disease, lumbar    Hyperlipidemia    Sleep apnea    CPAP    There are no active problems to display for this patient.   Past Surgical History:  Procedure Laterality Date   JOINT REPLACEMENT     KNEE ARTHROSCOPY Right 08/10/2016   Procedure: ARTHROSCOPY KNEE WITH DEBRIDEMENT,AND PARTIAL MEDIAL MENISCECTOMY, chondroplasty with microfracture of lateral femoral chondral;  Surgeon: Christena Flake, MD;  Location: [facility];  Service: Orthopedics;  Laterality: Right;  sleep apnea   KNEE SURGERY Left    TONSILLECTOMY         Home Medications    Prior to Admission medications   Medication Sig Start Date End Date Taking? Authorizing Provider  oseltamivir (TAMIFLU) 75 MG capsule Take 1 capsule (75 mg total) by mouth every 12 (twelve) hours. 08/21/23  Yes Grier Vu, DO  promethazine-dextromethorphan (PROMETHAZINE-DM) 6.25-15 MG/5ML syrup Take 5 mLs by mouth 4 (four) times daily as needed. 08/21/23  Yes Terianna Peggs, DO  diclofenac (VOLTAREN) 50 MG EC tablet Take 1 tablet (50 mg total) by mouth 3 (three) times daily. 04/24/19   Kerrin Champagne, MD  gabapentin (NEURONTIN) 300 MG capsule Take 1 capsule (300 mg total) by mouth at bedtime. 04/24/19   Kerrin Champagne, MD  rosuvastatin (CRESTOR) 10 MG tablet Take 10  mg by mouth daily.    [provider]  tamsulosin (FLOMAX) 0.4 MG CAPS capsule Take 0.4 mg by mouth daily.    [provider]    Family History Family History  Problem Relation Age of Onset   Kidney cancer Maternal Grandmother    Prostate cancer Neg Hx    Bladder Cancer Neg Hx     Social History Social History   Tobacco Use   Smoking status: Some Days    Types: Cigars   Smokeless tobacco: Never   Tobacco comments:    4 cigars/monthly  Vaping Use   Vaping status: Never Used  Substance Use Topics   Alcohol use: Yes    Comment: Occ   Drug use: Not Currently     Allergies   Patient has no known allergies.   Review of Systems Review of Systems: negative unless otherwise stated in HPI.      Physical Exam Triage Vital Signs ED Triage Vitals [08/21/23 1752]  Encounter Vitals Group     BP (!) 141/84     Systolic BP Percentile      Diastolic BP Percentile      Pulse Rate (!) 108     Resp      Temp (!) 102.3 F (39.1 C)     Temp Source Oral     SpO2  Weight      Height      Head Circumference      Peak Flow      Pain Score 3     Pain Loc      Pain Education      Exclude from Growth Chart    No data found.  Updated Vital Signs BP (!) 141/84 (BP Location: Left Arm)   Pulse (!) 108   Temp (!) 102.3 F (39.1 C) (Oral)   Visual Acuity Right Eye Distance:   Left Eye Distance:   Bilateral Distance:    Right Eye Near:   Left Eye Near:    Bilateral Near:     Physical Exam GEN:     alert, non-toxic appearing male in no distress    HENT:  mucus membranes moist, oropharyngeal without lesions or erythema, no tonsillar hypertrophy or exudates,  moderate erythematous edematous turbinates, clear nasal discharge EYES:   pupils equal and reactive, no scleral injection or discharge NECK:  normal ROM, no meningismus   RESP:  no increased work of breathing, bilateral coarse breath sounds, no wheezing CVS:   regular rate and rhythm Skin:    warm and dry, no rash on visible skin    UC Treatments / Results  Labs (all labs ordered are listed, but only abnormal results are displayed) Labs Reviewed  RESP PANEL BY RT-PCR (RSV, FLU A&B, COVID)  RVPGX2 - Abnormal; Notable for the following components:      Result Value   Influenza A by PCR POSITIVE (*)    All other components within normal limits    EKG   Radiology DG Chest 2 View Result Date: 08/21/2023 CLINICAL DATA:  Cough, fever EXAM: CHEST - 2 VIEW COMPARISON:  12/28/2009 FINDINGS: The heart size and mediastinal contours are within normal limits. Both lungs are clear. The visualized skeletal structures are unremarkable. IMPRESSION: No active cardiopulmonary disease. Electronically Signed   By: Charlett Nose M.D.   On: 08/21/2023 20:02    Procedures Procedures (including critical care time)  Medications Ordered in UC Medications  ibuprofen (ADVIL) tablet 800 mg (800 mg Oral Given 08/21/23 1808)    Initial Impression / Assessment and Plan / UC Course  I have reviewed the triage vital signs and the nursing notes.  Pertinent labs & imaging results that were available during my care of the patient were reviewed by me and considered in my medical decision making (see chart for details).       Pt is a 62 y.o. male who presents for 1-2 days of respiratory symptoms.  He is a former smoker.  On chart review, left upper lobe nodule seen on CT chest in August 2023  [name] is febrile here despite recent antipyretics.  Given 800 mg ibuprofen.  Satting well on room air. Overall pt is ill but non-toxic appearing, well hydrated, without respiratory distress. Pulmonary exam is remarkable for bilateral coarse breath sounds.  COVID, influenza and RSV panel obtained.  Chest x-ray recommended and he is agreeable.  States he worries that he has pneumonia. Chest xray personally reviewed by me without focal pneumonia, pleural effusion, cardiomegaly or pneumothorax. Patient aware the  radiologist has not read his xray and is comfortable with the preliminary read by me. Will review radiologist read when available and call patient if a change in plan is warranted.  Pt agreeable to this plan prior to discharge.   COVID test is negative however he is influenza A positive.  Discussed Tamiflu and  its side effects and this was prescribed.  Promethazine DM for cough. Discussed symptomatic treatment.  Typical duration of symptoms discussed.   Return and ED precautions given and voiced understanding. Discussed MDM, treatment plan and plan for follow-up with patient who agrees with plan.   Chest x-ray radiology impression reviewed.   Final Clinical Impressions(s) / UC Diagnoses   Final diagnoses:  Respiratory illness with fever  Influenza A     Discharge Instructions      You have the flu.  Your COVID and RSV test were negative.  Your chest x-ray did not show evidence of a pneumonia.  If the radiologist finds something that I did not, I will call you.   I sent a cough medication and Tamiflu to your pharmacy.  Stop by [facility] and pick them up.   You can take Tylenol and/or Ibuprofen as needed for fever reduction and pain relief.    For cough: use the prescription cough syrup. You can use a humidifier for chest congestion and cough.  If you don't have a humidifier, you can sit in the bathroom with the hot shower running.      For sore throat: try warm salt water gargles, Mucinex sore throat cough drops or cepacol lozenges, throat spray, warm tea or water with lemon/honey, popsicles or ice, or OTC cold relief medicine for throat discomfort. You can also purchase chloraseptic spray at the pharmacy or dollar store.   For congestion: take a daily anti-histamine like Zyrtec, Claritin, and a oral decongestant, such as pseudoephedrine.  You can also use Flonase 1-2 sprays in each nostril daily. Afrin is also a good option, if you do not have high blood pressure.    It is important  to stay hydrated: drink plenty of fluids (water, gatorade/powerade/pedialyte, juices, or teas) to keep your throat moisturized and help further relieve irritation/discomfort.    Return or go to the Emergency Department if symptoms worsen or do not improve in the next few days      ED Prescriptions     Medication Sig Dispense Auth. Provider   promethazine-dextromethorphan (PROMETHAZINE-DM) 6.25-15 MG/5ML syrup Take 5 mLs by mouth 4 (four) times daily as needed. 118 mL Naziah Weckerly, DO   oseltamivir (TAMIFLU) 75 MG capsule Take 1 capsule (75 mg total) by mouth every 12 (twelve) hours. 10 capsule Katha Cabal, DO      PDMP not reviewed this encounter.   Katha Cabal, DO 08/21/23 2038

## 2023-09-06 ENCOUNTER — Ambulatory Visit: Payer: Managed Care, Other (non HMO) | Attending: Cardiology | Admitting: Cardiology

## 2023-09-06 VITALS — BP 128/82 | HR 79 | Ht 69.0 in | Wt 192.0 lb

## 2023-09-06 DIAGNOSIS — E785 Hyperlipidemia, unspecified: Secondary | ICD-10-CM | POA: Diagnosis not present

## 2023-09-06 DIAGNOSIS — E878 Other disorders of electrolyte and fluid balance, not elsewhere classified: Secondary | ICD-10-CM

## 2023-09-06 DIAGNOSIS — R931 Abnormal findings on diagnostic imaging of heart and coronary circulation: Secondary | ICD-10-CM | POA: Diagnosis not present

## 2023-09-06 NOTE — Patient Instructions (Signed)
 Medication Instructions:   No changes  *If you need a refill on your cardiac medications before your next appointment, please call your pharmacy*   Lab Work:  Not needed   Testing/Procedures:  Not needed  Follow-Up: At [facility], you and your health needs are our priority.  As part of our continuing mission to provide you with exceptional heart care, we have created designated Provider Care Teams.  These Care Teams include your primary Cardiologist (physician) and Advanced Practice Providers (APPs -  Physician Assistants and Nurse Practitioners) who all work together to provide you with the care you need, when you need it.     Your next appointment:   8 month(s)  The format for your next appointment:   In Person  Provider:   Alm Clay, MD    Other Instructions  Will obtain information from previous  cardiologist

## 2023-09-06 NOTE — Progress Notes (Signed)
 Cardiology Office Note:  .   Date:  09/10/2023  ID:  Tommy Small, DOB 06-05-1961, MRN [id number] PCP: Clarice Nottingham, MD  [facility] Providers Cardiologist:  Alm Clay, MD   Previously Seen by Dr. Wolm Rhyme from [facility]  Chief Complaint  Patient presents with   New Patient (Initial Visit)    Establish cardiology care-elevated coronary calcium score    Patient Profile: .     Tommy Small is a  63 y.o. male former smoker with a PMH noted below who presents here for cardiology evaluation based on elevated coronary calcium score at the request of Clarice Nottingham, MD.  Coronary Calcium Score 667 -> with nonischemic Myoview (October 2022) Fam Hx of CAD HLD    Tommy Small was last seen on 06/23/2021 by Dr. Rhyme.  He was started on based on his cardiovascular risk.  Discussed dietary modification.  Lots of anecdotal data noted but nothing about this patient's symptoms.  He had been evaluated with a normal stress test.  Discussed diet and exercise/lifestyle modification.   Subjective  Discussed the use of AI scribe software for clinical note transcription with the patient, who gave verbal consent to proceed.  History of Present Illness   The patient, previously seen by Dr. Rhyme in 2022 for a Coronary Calcium Score of 667 and was evaluated with a non-ischemic nuclear stress test.  He has transferred PCP care to Dr. Clarice, who has referred him to determine if any additional testing is necessary - he has an acquaintance who was evaluated with a Coronary CTA and eventually had a Cardiac Cath & revascularization.  He apparently had asked Dr. Rhyme about this and mentioned it to Dr. Clarice.  Unfortunately, the stress test results are not available on Care Everywhere.   He recalls having a stress test at [facility] approximately 30 years ago but does not remember having one more recently (does not recall the test in 2022).   The patient has been on Crestor  for several years, with a recent increase to 20mg  daily (this is not indicated on his current Med List). The patient's LDL cholesterol level was 886 in 2023 and had decreased to 88 by 2024. The patient is not currently on any blood pressure medications.  The patient does not report any symptoms of heart disease, such as chest pain or shortness of breath. The patient's activity level is relatively low, and there is a plan to increase physical activity.  He describes his activity level as not particularly high, with weekly golf and woodworking as the main forms of physical activity. He works at [employer] job, which involves a significant amount of sitting. He denies symptoms such as chest pain, shortness of breath, or leg swelling.  He does have some exertional dyspnea more likely related to deconditioning. No PND or orthopnea.  No rapid or irregular heartbeats / palpitations, syncope/near syncope or TIA/CVA/amaurosis fugax.  No claudication.   He has a history of high cholesterol and is currently being treated with Crestor. His blood pressure is well controlled, and there is no history of diabetes.      ROS:  Review of Systems - Negative except symptoms noted in HPI    Objective   Social and Family History  Social History reports that he quit smoking about 21 years ago. His smoking use included cigarettes. He started smoking about 46 years ago. He smoked 1.00 pack per day. He has never used smokeless tobacco. He  reports that he does not drink alcohol and does not use drugs. Married, father of 1 son who lives in [place].  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 [employer]; Not very active, but does care for a large piece of property  Family History Family History  Problem Relation Age of Onset   No Known Problems Mother   Heart disease Father   No Known Problems Sister   Medications - Crestor 20 mg daily - ASA 81 mg daily - Losartan 50 mg daily listed on PCP note (not on current  medication list) Other medications on PCP list not on [facility] List: trazadone 100 mg PO qhs Medications on [facility] List not on PCP list: finasteride 5 mg daily, tamsulosin 0.4 mg daily  Studies Reviewed: SABRA   EKG Interpretation Date/Time:  Wednesday September 06 2023 09:42:28 EST Ventricular Rate:  79 PR Interval:  164 QRS Duration:  84 QT Interval:  362 QTC Calculation: 415 R Axis:   29  Text Interpretation: Normal sinus rhythm with sinus arrhythmia Normal ECG No previous ECGs available Confirmed by Anner Lenis ([id number]) on 09/06/2023 9:50:27 AM    Coronary Calcium Score 05/11/2021: LM 55.  LAD 299, LCx 25, RCA 17 open total score 667 Nuclear Stress Test 05/2021:   Report not available. ->  Described as normal during follow-up cardiology visit.  Labs: Most recent labs available from July 2024 TC 161, TG 106, HDL 54, LDL 88.  A1c 5.6; glucose 88 05/05/2022: CBC with WBC BC 4.1, H/H 15.1/46.0, PLT 182.;  NA 143, K5.1, CL 103, CO2 25, BUN 16, Cr 0.8,Glu 80, Ca 9.7.  LFTs normal  Risk Assessment/Calculations:           Physical Exam:   VS:  BP 128/82 (BP Location: Right Arm, Patient Position: Sitting, Cuff Size: Normal)   Pulse 79   Ht 5' 9 (1.753 m)   Wt 192 lb (87.1 kg)   SpO2 98%   BMI 28.35 kg/m    Wt Readings from Last 3 Encounters:  09/06/23 192 lb (87.1 kg)  07/04/22 180 lb 9.6 oz (81.9 kg)  04/24/19 200 lb (90.7 kg)    GEN: Well nourished, well groomed in no acute distress; healthy-appearing. NECK: No JVD; No carotid bruits CARDIAC: Normal S1, S2; RRR, no murmurs, rubs, gallops RESPIRATORY:  Clear to auscultation without rales, wheezing or rhonchi ; nonlabored, good air movement. ABDOMEN: Soft, non-tender, non-distended EXTREMITIES:  No edema; No deformity      ASSESSMENT AND PLAN: .    Problem List Items Addressed This Visit       Cardiology Problems   Agatston coronary artery calcium score greater than 400 - Primary (Chronic)   Elevated coronary calcium score  (667) indicating atherosclerotic plaque, but no symptoms of angina or exertional chest pain. Previous stress test reportedly normal.  LDL cholesterol slightly elevated at 88 (goal <70). Discussed the pathophysiology of atherosclerosis and the importance of risk factor management. -Recent increase in Crestor to 20mg  daily to lower LDL cholesterol. -Start 81mg  aspirin daily for secondary prevention (med listed, but NOT taking) - will restart -Encourage increased physical activity for cardiovascular health and symptom  monitoring. -In the absence of concerning cardiac symptoms of chest pain, there is no indication for additional testing to evaluate for ischemia.       Hyperlipidemia LDL goal <70 (Chronic)   Based on Cardiac Calcium Score 667, target LDL should be less than 70, most recent LDL was 88 have been added he had  his Crestor recently increased to 20.' -Check cholesterol levels in August 2025 with Dr. Clarice. -Follow-up in September 2025 after cholesterol results are available.      Relevant Orders   EKG 12-Lead (Completed)    Follow-Up:  -Check cholesterol levels in August 2025 with Dr. Clarice. -Follow-up in September 2025  Return in about 8 months (around 05/06/2024) for Routine follow up with me.after cholesterol results are available.  I spent 61 minutes in the care of Tommy Small today including reviewing outside labs from PCP (3 min), reviewing studies (3 min), reviewing outside studies (6 min), face to face time discussing treatment options (34), reviewing records from PCP & [facility] [place] ( ), 8 min dictating, and documenting in the encounter.     Signed, Alm MICAEL Clay, MD, MS Alm Clay, M.D., M.S. Interventional Cardiologist  [facility]  Pager # [phone] Phone # [phone] [address] [place], [place] [address]

## 2023-09-10 ENCOUNTER — Encounter: Payer: Self-pay | Admitting: Cardiology

## 2023-09-10 DIAGNOSIS — R931 Abnormal findings on diagnostic imaging of heart and coronary circulation: Secondary | ICD-10-CM | POA: Insufficient documentation

## 2023-09-10 DIAGNOSIS — E785 Hyperlipidemia, unspecified: Secondary | ICD-10-CM | POA: Insufficient documentation

## 2023-09-10 NOTE — Assessment & Plan Note (Signed)
 Elevated coronary calcium score (667) indicating atherosclerotic plaque, but no symptoms of angina or exertional chest pain. Previous stress test reportedly normal.  LDL cholesterol slightly elevated at 88 (goal <70). Discussed the pathophysiology of atherosclerosis and the importance of risk factor management. -Recent increase in Crestor to 20mg  daily to lower LDL cholesterol. -Start 81mg  aspirin daily for secondary prevention (med listed, but NOT taking) - will restart -Encourage increased physical activity for cardiovascular health and symptom  monitoring. -In the absence of concerning cardiac symptoms of chest pain, there is no indication for additional testing to evaluate for ischemia.

## 2023-09-10 NOTE — Assessment & Plan Note (Addendum)
 Based on Cardiac Calcium Score 667, target LDL should be less than 70, most recent LDL was 88 have been added he had his Crestor recently increased to 20.' -Check cholesterol levels in August 2025 with Dr. Clarice. -Follow-up in September 2025 after cholesterol results are available.

## 2024-06-27 ENCOUNTER — Ambulatory Visit: Admitting: Cardiology

## 2024-08-08 ENCOUNTER — Ambulatory Visit: Attending: Cardiology | Admitting: Cardiology

## 2024-08-08 ENCOUNTER — Encounter: Payer: Self-pay | Admitting: Cardiology

## 2024-08-08 VITALS — BP 144/88 | HR 74 | Ht 69.0 in | Wt 188.8 lb

## 2024-08-08 DIAGNOSIS — I10 Essential (primary) hypertension: Secondary | ICD-10-CM | POA: Insufficient documentation

## 2024-08-08 DIAGNOSIS — E785 Hyperlipidemia, unspecified: Secondary | ICD-10-CM

## 2024-08-08 DIAGNOSIS — R931 Abnormal findings on diagnostic imaging of heart and coronary circulation: Secondary | ICD-10-CM

## 2024-08-08 MED ORDER — LOSARTAN POTASSIUM 25 MG PO TABS: 25.0000 mg | ORAL_TABLET | Freq: Every day | ORAL | 3 refills | Status: AC

## 2024-08-08 NOTE — Assessment & Plan Note (Signed)
 Blood pressure 145/90 mmHg. No symptoms. Losartan chosen for efficacy and lower cough risk. Avoided diuretics due to urinary issues. - Prescribed losartan 25 mg daily. - Encouraged increased physical activity and reduced salt intake.

## 2024-08-08 NOTE — Progress Notes (Signed)
 Cardiology Office Note:  .   Date:  08/10/2024  ID:  Tommy Small, DOB 09-26-1960, MRN [id number] PCP: Clarice Nottingham, MD  [facility] Providers Cardiologist:  Alm Clay, MD     Chief Complaint  Patient presents with   Follow-up    8 month f/u. Meds reviewed verbally with pt.    Patient Profile: .     Tommy Small is a 63 y.o. male former smoker (quit 22 years) factors) with a PMH notable for family history of CAD, HLD and Coronary Calcium Score of 667 in 2022 who presents here for essentially annual follow-up at the request of Clarice Nottingham, MD.  Coronary Calcium Score 667 -> with nonischemic Myoview (October 2022); previously followed with Dr. Hester from [facility] cardiology Fam Hx of CAD HLD-on 20 mg Crestor      NICK STULTS was last seen on 09/06/2023: Denied any cardiac symptoms of chest pain or pressure at rest or exertion.  Was open increase his physical activity level-somewhat sedentary and deconditioned with some exertional dyspnea as a result.  Suggested that he restart taking aspirin and increase physical activity.  Subjective  Discussed the use of AI scribe software for clinical note transcription with the patient, who gave verbal consent to proceed.  History of Present Illness Tommy Small is a 63 year old male with hypertension and coronary artery disease who presents for follow-up regarding elevated blood pressure readings.  He has elevated blood pressure readings, with today's measurement at approximately 145/90 mmHg, which he considers high compared to his usual 120/80 mmHg. He recalls being prescribed blood pressure medication in the past but discontinued it when his blood pressure normalized. He is not currently on any antihypertensive medication and is unsure of the previous medication's name.  He is currently taking Crestor, 20 mg, and recalls the dose was increased after a coronary calcium score was performed. He denies issues  with heart rate, chest pain, dizziness, headaches, blurred vision, or leg swelling. He is also on finasteride for prostate issues and reports difficulty urinating without it. He mentions inconsistent use of a baby aspirin.  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 Family history is significant for coronary artery disease, with his father having had a heart attack at the age of 42. No chest pain, pressure, tightness, palpitations, dizziness, headaches, blurred vision, leg swelling, pass-out spells, or episodes of dizziness.  Cardiovascular ROS: no chest pain or dyspnea on exertion negative for - edema, irregular heartbeat, loss of consciousness, orthopnea, palpitations, paroxysmal nocturnal dyspnea, rapid heart rate, shortness of breath, or syncope/degree or TIA/ thrombosis progress, claudication  ROS:  Review of Systems - Negative except noted elevated BP @ work wellness check    Objective   Medications: Aspirin 81 mg daily, Crestor 20 mg daily.  Active Medications[1]   Studies Reviewed: SABRA       Labs: Most recent labs available from July 2024 TC 161, TG 106, HDL 54, LDL 88.  A1c 5.6; glucose 88 05/05/2022: CBC with WBC BC 4.1, H/H 15.1/46.0, PLT 182.;  NA 143, K5.1, CL 103, CO2 25, BUN 16, Cr 0.8,Glu 80, Ca 9.7.  LFTs normal Labs checked by PCP in August 2025 not available Coronary Calcium Score (05/11/2021): Total score 667.  LM 56, LAD 299, LCx 295, RCA 17.  Aortic atherosclerosis also noted.  Risk Assessment/Calculations:  Physical Exam:   VS:  BP (!) 144/88   Pulse 74   Ht 5' 9 (1.753 m)   Wt 188 lb 12.8 oz (85.6 kg)   SpO2 98%   BMI 27.88 kg/m    Wt Readings from Last 3 Encounters:  08/08/24 188 lb 12.8 oz (85.6 kg)  09/06/23 192 lb (87.1 kg)  07/04/22 180 lb 9.6 oz (81.9 kg)      GEN: Healthy appearing.  Well nourished, well groomed; in no acute distress;  NECK: No JVD; No carotid  bruits CARDIAC: Normal S1, S2; RRR, no murmurs, rubs, gallops RESPIRATORY:  Clear to auscultation without rales, wheezing or rhonchi ; nonlabored, good air movement. ABDOMEN: Soft, non-tender, non-distended EXTREMITIES:  No edema; No deformity      ASSESSMENT AND PLAN: .    Problem List Items Addressed This Visit       Cardiology Problems   Agatston coronary artery calcium score greater than 400 - Primary (Chronic)   Coronary artery disease with a coronary calcium score in the 96th percentile. No angina or myocardial infarction symptoms. Statins reduce cholesterol and stabilize plaque. - Continue Crestor 20 mg daily. - Ensure aspirin is taken at least four days a week.      Relevant Medications   rosuvastatin (CRESTOR) 20 MG tablet   losartan  (COZAAR ) 25 MG tablet   Hyperlipidemia LDL goal <70 (Chronic)   Cholesterol levels well-controlled with Crestor. Statins reduce cholesterol and stabilize plaque. Discussed potential side effects. Labs checked by PCP.  He will send in results.  Was told that they were within range  - Continue Crestor 20 mg daily.      Relevant Medications   rosuvastatin (CRESTOR) 20 MG tablet   losartan  (COZAAR ) 25 MG tablet   Primary hypertension (Chronic)   Blood pressure 145/90 mmHg. No symptoms. Losartan  chosen for efficacy and lower cough risk. Avoided diuretics due to urinary issues. - Prescribed losartan  25 mg daily. - Encouraged increased physical activity and reduced salt intake.      Relevant Medications   rosuvastatin (CRESTOR) 20 MG tablet   losartan  (COZAAR ) 25 MG tablet    Recording duration: 27 minutes        Follow-Up: Return in about 1 year (around 08/08/2025) for Routine follow up with me, [place] office.  I spent 37 minutes in the care of PRUDENCIO VELAZCO today including reviewing studies (1 min), face to face time discussing treatment options (27), 8 minutes dictating, and documenting in the encounter.      Signed, Alm MICAEL Clay, MD, MS Alm Clay, M.D., M.S. Interventional Cardiologist  [facility] Pager # [phone]          [1]  Current Meds  Medication Sig   finasteride (PROSCAR) 5 MG tablet 1 tablet Orally Once a day for 90 days   losartan  (COZAAR ) 25 MG tablet Take 1 tablet (25 mg total) by mouth daily.   rosuvastatin (CRESTOR) 10 MG tablet Take 10 mg by mouth daily.   rosuvastatin (CRESTOR) 20 MG tablet Take 20 mg by mouth daily.   tamsulosin (FLOMAX) 0.4 MG CAPS capsule Take 0.4 mg by mouth daily.

## 2024-08-08 NOTE — Assessment & Plan Note (Signed)
 Cholesterol levels well-controlled with Crestor. Statins reduce cholesterol and stabilize plaque. Discussed potential side effects. Labs checked by PCP.  He will send in results.  Was told that they were within range  - Continue Crestor 20 mg daily.

## 2024-08-08 NOTE — Assessment & Plan Note (Signed)
 Coronary artery disease with a coronary calcium score in the 96th percentile. No angina or myocardial infarction symptoms. Statins reduce cholesterol and stabilize plaque. - Continue Crestor 20 mg daily. - Ensure aspirin is taken at least four days a week.

## 2024-08-08 NOTE — Patient Instructions (Signed)
 Medication Instructions:   Your physician recommends the following medication changes.  START TAKING: Losartan 25 mg once daily  *If you need a refill on your cardiac medications before your next appointment, please call your pharmacy*  Lab Work:  None ordered at this time   If you have labs (blood work) drawn today and your tests are completely normal, you will receive your results only by:  MyChart Message (if you have MyChart) OR  A paper copy in the mail If you have any lab test that is abnormal or we need to change your treatment, we will call you to review the results.  Testing/Procedures:  None ordered at this time   Referrals:  None ordered at this time   Follow-Up:  At [facility], you and your health needs are our priority.  As part of our continuing mission to provide you with exceptional heart care, our providers are all part of one team.  This team includes your primary Cardiologist (physician) and Advanced Practice Providers or APPs (Physician Assistants and Nurse Practitioners) who all work together to provide you with the care you need, when you need it.  Your next appointment:   1 year(s)  Provider:    You may see Alm Clay, MD or one of the following Advanced Practice Providers on your designated Care Team:   Lonni Meager, NP Lesley Maffucci, PA-C Bernardino Bring, PA-C Cadence Attapulgus, PA-C Tylene Lunch, NP Barnie Hila, NP    We recommend signing up for the patient portal called MyChart.  Sign up information is provided on this After Visit Summary.  MyChart is used to connect with patients for Virtual Visits (Telemedicine).  Patients are able to view lab/test results, encounter notes, upcoming appointments, etc.  Non-urgent messages can be sent to your provider as well.   To learn more about what you can do with MyChart, go to [email or web address].
